# Patient Record
Sex: Female | Born: 1985 | Race: White | Hispanic: Yes | Marital: Married | Smoking: Never smoker
Health system: Southern US, Community
[De-identification: ages and names within clinical notes are randomized; demographics above are authoritative.]

## PROBLEM LIST (undated history)

## (undated) DIAGNOSIS — R42 Dizziness and giddiness: Secondary | ICD-10-CM

## (undated) DIAGNOSIS — R519 Headache, unspecified: Secondary | ICD-10-CM

## (undated) HISTORY — PX: NO PAST SURGERIES: SHX2092

## (undated) HISTORY — DX: Dizziness and giddiness: R42

## (undated) HISTORY — DX: Headache, unspecified: R51.9

---

## 2004-01-21 ENCOUNTER — Ambulatory Visit (HOSPITAL_COMMUNITY): Admission: RE | Admit: 2004-01-21 | Discharge: 2004-01-21 | Payer: Self-pay | Admitting: *Deleted

## 2004-06-06 ENCOUNTER — Ambulatory Visit: Payer: Self-pay | Admitting: *Deleted

## 2004-06-06 ENCOUNTER — Inpatient Hospital Stay (HOSPITAL_COMMUNITY): Admission: AD | Admit: 2004-06-06 | Discharge: 2004-06-09 | Payer: Self-pay | Admitting: *Deleted

## 2007-11-07 ENCOUNTER — Ambulatory Visit (HOSPITAL_COMMUNITY): Admission: RE | Admit: 2007-11-07 | Discharge: 2007-11-07 | Payer: Self-pay | Admitting: Obstetrics & Gynecology

## 2008-04-11 ENCOUNTER — Ambulatory Visit: Payer: Self-pay | Admitting: Obstetrics & Gynecology

## 2008-04-11 ENCOUNTER — Ambulatory Visit (HOSPITAL_COMMUNITY): Admission: RE | Admit: 2008-04-11 | Discharge: 2008-04-11 | Payer: Self-pay | Admitting: Family Medicine

## 2008-04-14 ENCOUNTER — Ambulatory Visit: Payer: Self-pay | Admitting: Obstetrics and Gynecology

## 2008-04-14 ENCOUNTER — Inpatient Hospital Stay (HOSPITAL_COMMUNITY): Admission: AD | Admit: 2008-04-14 | Discharge: 2008-04-16 | Payer: Self-pay | Admitting: Obstetrics & Gynecology

## 2008-04-14 ENCOUNTER — Inpatient Hospital Stay (HOSPITAL_COMMUNITY): Admission: AD | Admit: 2008-04-14 | Discharge: 2008-04-14 | Payer: Self-pay | Admitting: Obstetrics & Gynecology

## 2008-04-14 ENCOUNTER — Encounter (HOSPITAL_COMMUNITY): Payer: Self-pay | Admitting: Obstetrics and Gynecology

## 2008-11-08 ENCOUNTER — Inpatient Hospital Stay (HOSPITAL_COMMUNITY): Admission: AD | Admit: 2008-11-08 | Discharge: 2008-11-09 | Payer: Self-pay | Admitting: Obstetrics & Gynecology

## 2008-11-09 ENCOUNTER — Encounter: Payer: Self-pay | Admitting: Obstetrics & Gynecology

## 2008-11-26 ENCOUNTER — Ambulatory Visit: Payer: Self-pay | Admitting: Obstetrics and Gynecology

## 2008-11-26 LAB — CONVERTED CEMR LAB: hCG, Beta Chain, Quant, S: 462 milliintl units/mL

## 2008-11-27 ENCOUNTER — Ambulatory Visit (HOSPITAL_COMMUNITY): Admission: RE | Admit: 2008-11-27 | Discharge: 2008-11-27 | Payer: Self-pay | Admitting: Obstetrics and Gynecology

## 2008-12-03 ENCOUNTER — Ambulatory Visit: Payer: Self-pay | Admitting: Obstetrics & Gynecology

## 2008-12-03 ENCOUNTER — Encounter: Payer: Self-pay | Admitting: Obstetrics and Gynecology

## 2008-12-03 LAB — CONVERTED CEMR LAB: hCG, Beta Chain, Quant, S: 131.3 milliintl units/mL

## 2008-12-17 ENCOUNTER — Ambulatory Visit: Payer: Self-pay | Admitting: Obstetrics and Gynecology

## 2010-09-13 LAB — HCG, QUANTITATIVE, PREGNANCY: hCG, Beta Chain, Quant, S: 8242 m[IU]/mL — ABNORMAL HIGH (ref <5)

## 2010-09-13 LAB — GC/CHLAMYDIA PROBE AMP, GENITAL: GC Probe Amp, Genital: NEGATIVE

## 2010-09-13 LAB — WET PREP, GENITAL
Clue Cells Wet Prep HPF POC: NONE SEEN
Trich, Wet Prep: NONE SEEN

## 2010-09-13 LAB — CBC: MCHC: 35.4 g/dL (ref 30.0–36.0)

## 2010-09-13 LAB — ABO/RH: ABO/RH(D): O POS

## 2010-10-19 NOTE — Group Therapy Note (Signed)
Ariana Downs, VIRAG NO.:  [id number]   MEDICAL RECORD NO.:  [id number]          PATIENT TYPE:  WOC   LOCATION:  [facility]                   FACILITY:  [facility]   PHYSICIAN:  Argentina Donovan, MD        DATE OF BIRTH:  01-13-1986   DATE OF SERVICE:  11/26/2008                                  CLINIC NOTE   The patient is a 25 year old gravida 3, para 2-0-1-2 who was seen in the  maternity admissions office on June 5 and 6.  She had a missed abortion  at 9 weeks, was given with a quantitative beta hCG of 8242.  Was given  Cytotec at that time.  She passed everything the following day, had  heavy bleeding.  She has just spotted since that time.  Comes in with no  complaints.  She had a Pap smear in March of this year.  She has decided  on Depo-Provera as a method of contraception.  She does not want any  pregnancies in the near future.  We are going to give her her first shot  today and [facility].  Going to get a quantitative beta hCG.  If it is elevated still we will  call her and give her an ultrasound to confirm the completeness of the  abortion.           ______________________________  Argentina Donovan, MD     PR/MEDQ  D:  11/26/2008  T:  11/26/2008  Job:  [id number]

## 2010-10-29 IMAGING — US US OB COMP LESS 14 WK
1 series · 14 of 20 positions shown · non-contrast
Comparison: None this gestation.

CLINICAL DATA: 23-year-old G3 P2, LMP 08/10/2008, presenting with
cramping and vaginal bleeding with clots.

OBSTETRIC <14 WK ULTRASOUND 11/08/2008:
TECHNIQUE: Transabdominal ultrasound was performed for evaluation
of the gestation as well as the maternal uterus and adnexal
regions.

[Series 1: us ob comp less 14 wks · 0.24mm/px · 14 of 20 slices shown]
[im 1/20]
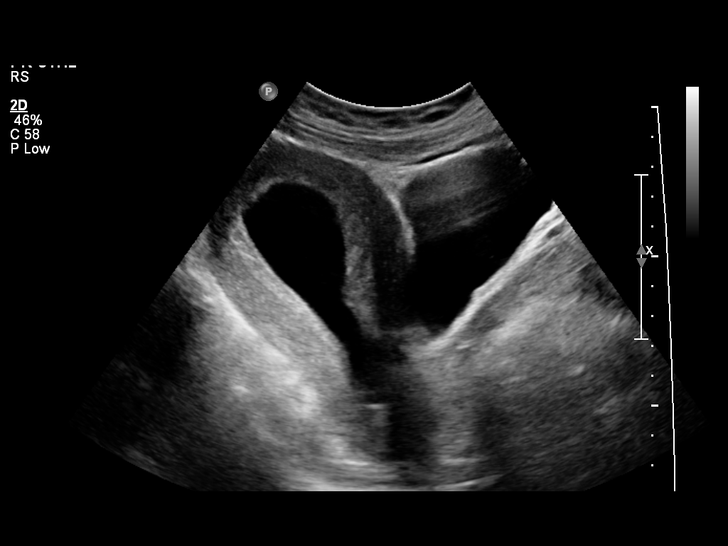
[im 3/20]
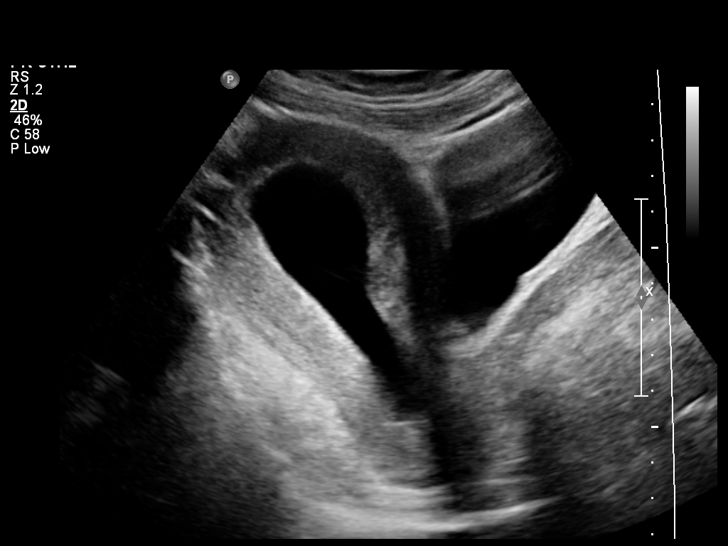
[im 4/20]
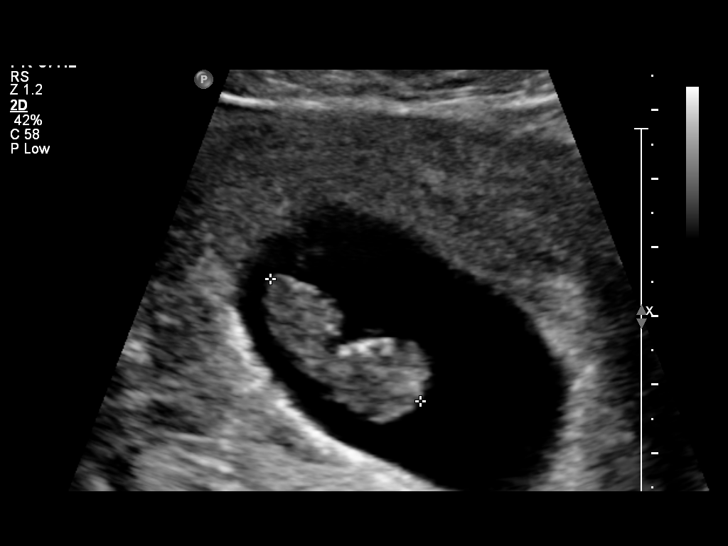
[im 6/20]
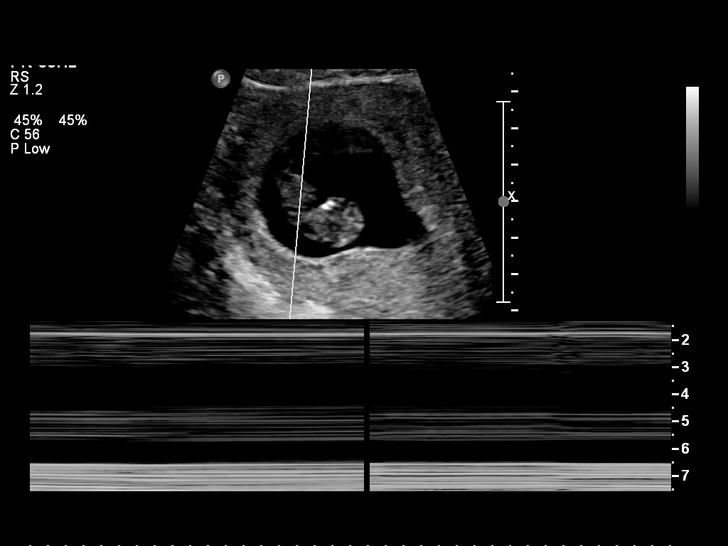
[im 7/20]
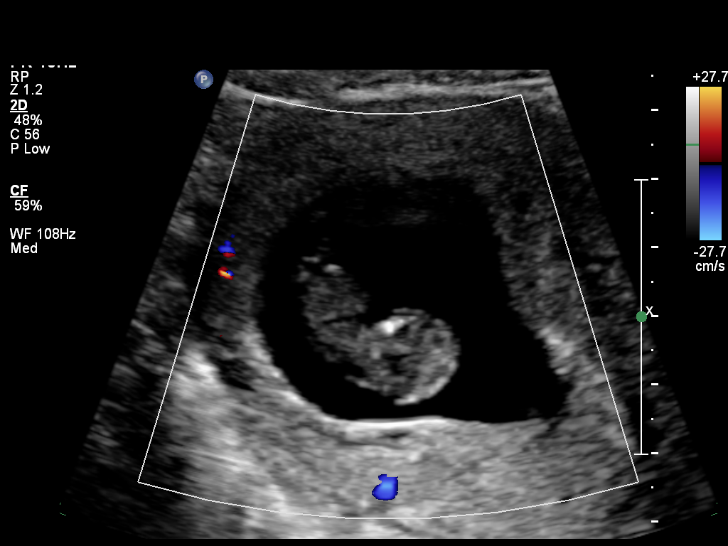
[im 8/20]
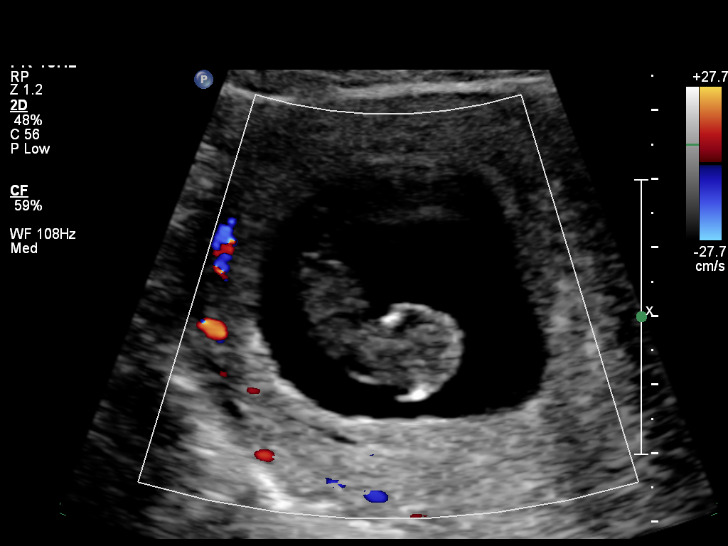
[im 10/20]
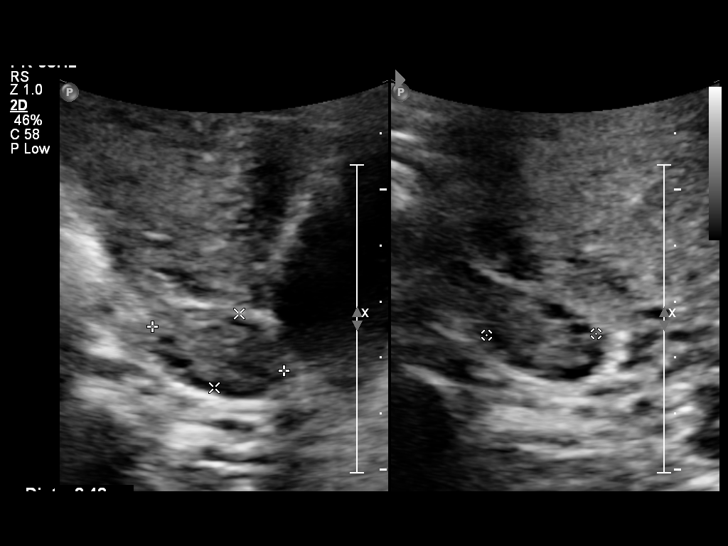
[im 11/20]
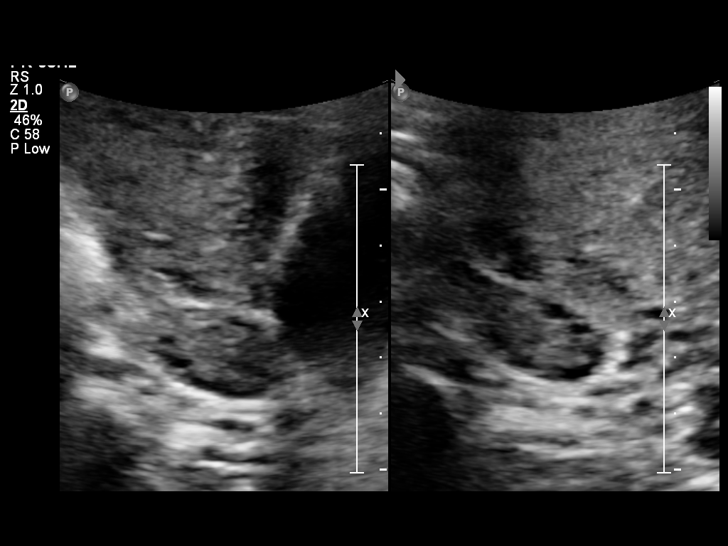
[im 13/20]
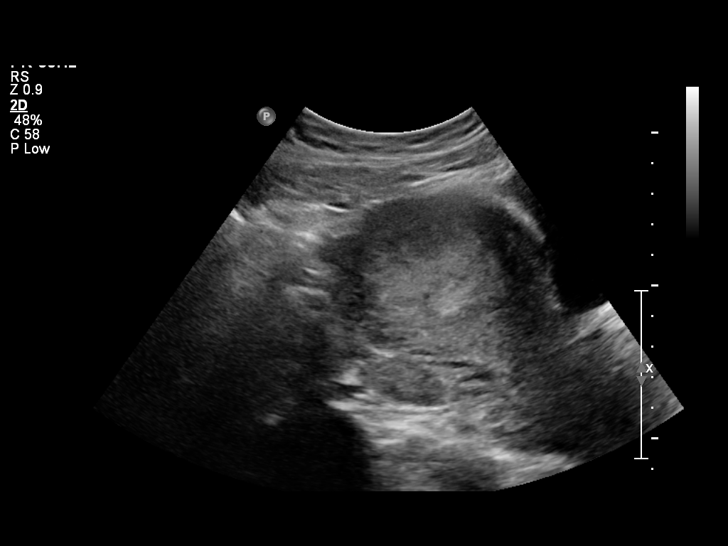
[im 14/20]
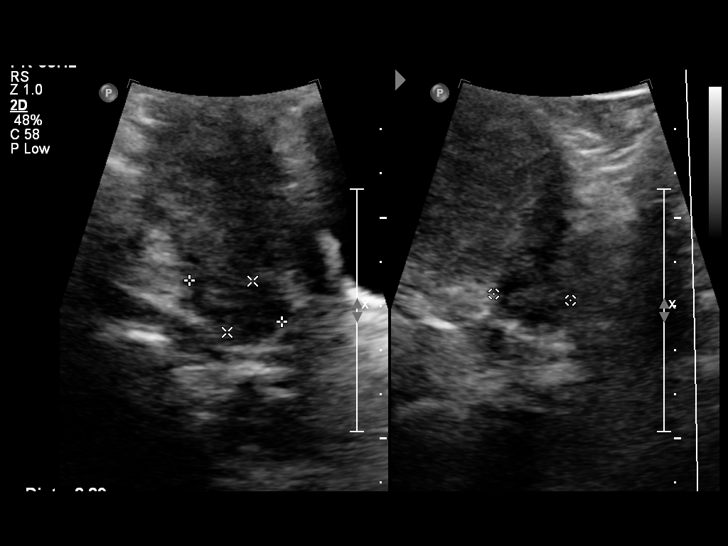
[im 16/20]
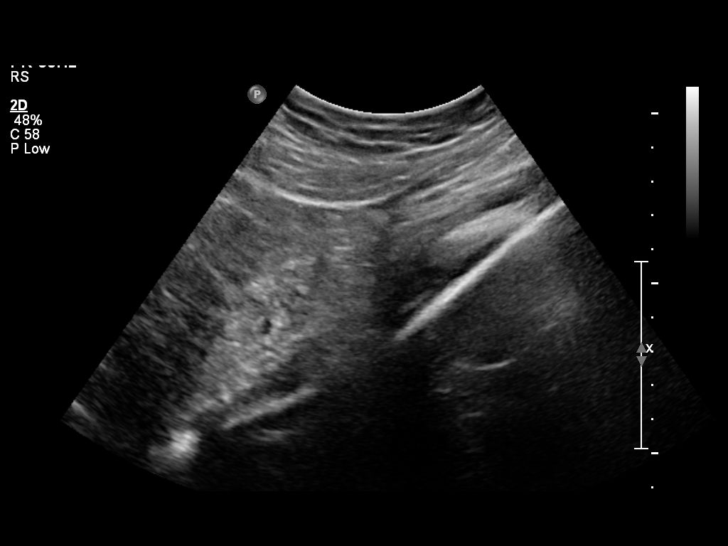
[im 17/20]
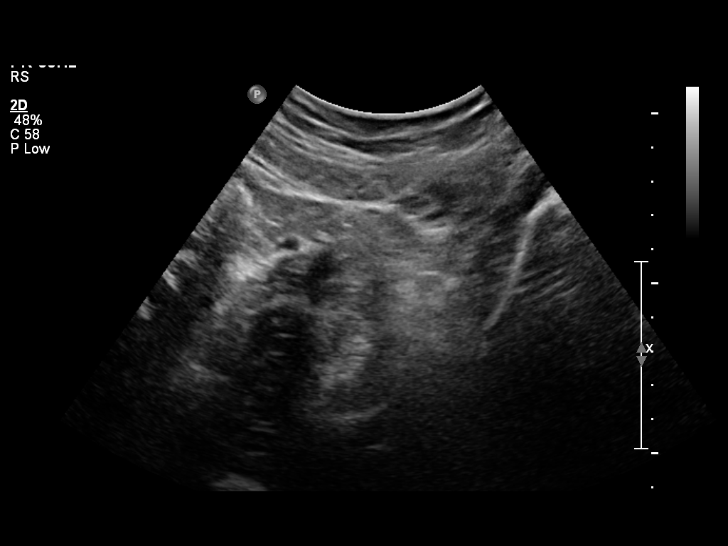
[im 18/20]
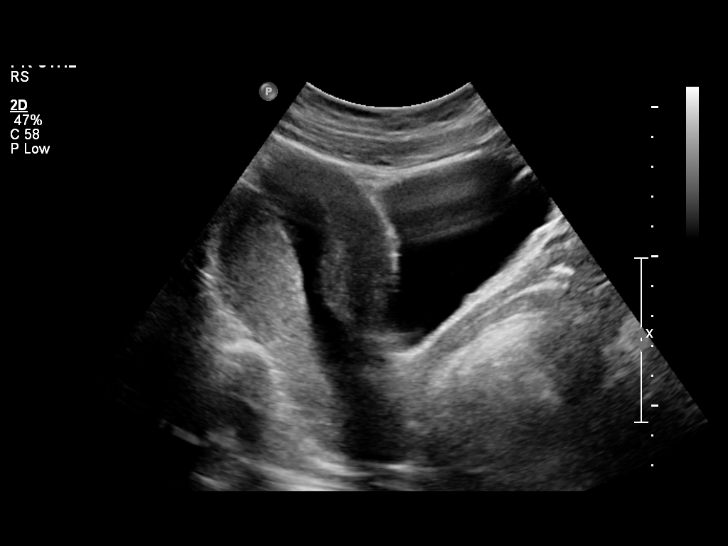
[im 20/20]
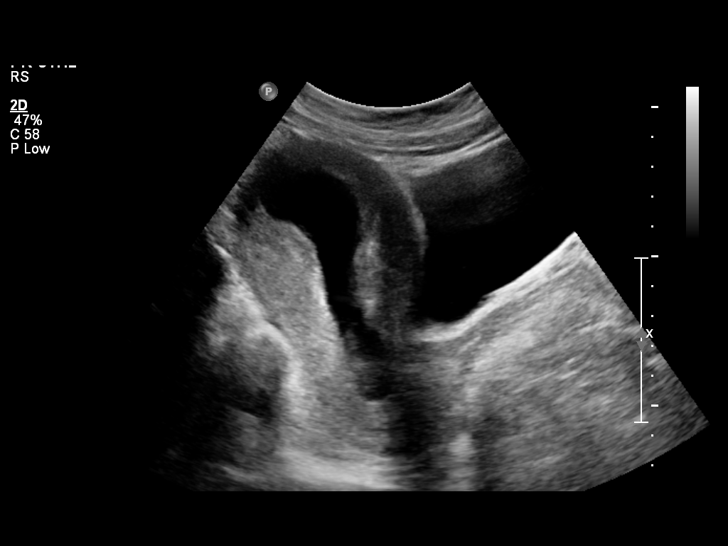

[14 of 20 positions shown; findings below may reference images not displayed]

Intrauterine gestational sac: Single, irregular in shape and
extending into the endocervical canal.
Yolk sac: Not visualized.
Embryo: Visualized.
Cardiac Activity: Not visualized. This was confirmed on the cine
clips submitted by the ultrasound technologist.

CRL:  2.79 cm         9w  5d

Maternal uterus/Adnexae:
No evidence of subchorionic hemorrhage.  Both ovaries visualized
and both are normal in appearance, containing small follicular
cysts.  Right ovary approximates 2.5 x 1.4 x 2.0 cm.  Left ovary
approximates 2.3 x 1.3 x 1.8 cm.  No adnexal masses or free fluid.
IMPRESSION: 1.  Intrauterine fetal demise.
2.  Gestational sac extending into the endocervical canal.
3.  Normal-appearing ovaries.  No adnexal masses or free fluid.

## 2011-03-08 LAB — CBC
Hemoglobin: 13
MCHC: 33.4
Platelets: 185
RBC: 4.11
RDW: 13.2
WBC: 14 — ABNORMAL HIGH

## 2014-01-07 ENCOUNTER — Encounter (HOSPITAL_COMMUNITY): Payer: Self-pay | Admitting: Emergency Medicine

## 2014-01-07 ENCOUNTER — Emergency Department (HOSPITAL_COMMUNITY)
Admission: EM | Admit: 2014-01-07 | Discharge: 2014-01-08 | Disposition: A | Discharge status: Home / Self Care | Payer: Self-pay | Attending: Emergency Medicine | Admitting: Emergency Medicine

## 2014-01-07 DIAGNOSIS — R300 Dysuria: Secondary | ICD-10-CM | POA: Insufficient documentation

## 2014-01-07 DIAGNOSIS — Z792 Long term (current) use of antibiotics: Secondary | ICD-10-CM | POA: Insufficient documentation

## 2014-01-07 DIAGNOSIS — N12 Tubulo-interstitial nephritis, not specified as acute or chronic: Secondary | ICD-10-CM | POA: Insufficient documentation

## 2014-01-07 DIAGNOSIS — Z3202 Encounter for pregnancy test, result negative: Secondary | ICD-10-CM | POA: Insufficient documentation

## 2014-01-07 DIAGNOSIS — R110 Nausea: Secondary | ICD-10-CM | POA: Insufficient documentation

## 2014-01-07 DIAGNOSIS — R000 Tachycardia, unspecified: Secondary | ICD-10-CM | POA: Insufficient documentation

## 2014-01-07 LAB — CBC WITH DIFFERENTIAL/PLATELET
BASOS PCT: 0 % (ref 0–1)
Basophils Absolute: 0 10*3/uL (ref 0.0–0.1)
Eosinophils Absolute: 0 10*3/uL (ref 0.0–0.7)
Eosinophils Relative: 0 % (ref 0–5)
HCT: 43.3 % (ref 36.0–46.0)
Hemoglobin: 14.1 g/dL (ref 12.0–15.0)
LYMPHS ABS: 1.3 10*3/uL (ref 0.7–4.0)
LYMPHS PCT: 10 % — AB (ref 12–46)
MCH: 28.5 pg (ref 26.0–34.0)
MCHC: 32.6 g/dL (ref 30.0–36.0)
MCV: 87.7 fL (ref 78.0–100.0)
MONO ABS: 0.5 10*3/uL (ref 0.1–1.0)
Monocytes Relative: 4 % (ref 3–12)
Neutro Abs: 11.4 10*3/uL — ABNORMAL HIGH (ref 1.7–7.7)
Neutrophils Relative %: 86 % — ABNORMAL HIGH (ref 43–77)
Platelets: 253 10*3/uL (ref 150–400)
RBC: 4.94 MIL/uL (ref 3.87–5.11)
RDW: 13.2 % (ref 11.5–15.5)
WBC: 13.2 10*3/uL — ABNORMAL HIGH (ref 4.0–10.5)

## 2014-01-07 LAB — URINALYSIS, ROUTINE W REFLEX MICROSCOPIC
BILIRUBIN URINE: NEGATIVE
Glucose, UA: NEGATIVE mg/dL
KETONES UR: NEGATIVE mg/dL
NITRITE: NEGATIVE
PH: 7 (ref 5.0–8.0)
Protein, ur: 100 mg/dL — AB
SPECIFIC GRAVITY, URINE: 1.014 (ref 1.005–1.030)
UROBILINOGEN UA: 1 mg/dL (ref 0.0–1.0)

## 2014-01-07 LAB — COMPREHENSIVE METABOLIC PANEL
ALBUMIN: 4 g/dL (ref 3.5–5.2)
ALK PHOS: 79 U/L (ref 39–117)
ALT: 47 U/L — AB (ref 0–35)
AST: 43 U/L — AB (ref 0–37)
Anion gap: 15 (ref 5–15)
BILIRUBIN TOTAL: 0.4 mg/dL (ref 0.3–1.2)
BUN: 14 mg/dL (ref 6–23)
CALCIUM: 9.7 mg/dL (ref 8.4–10.5)
CHLORIDE: 99 meq/L (ref 96–112)
CO2: 23 mEq/L (ref 19–32)
Creatinine, Ser: 1.13 mg/dL — ABNORMAL HIGH (ref 0.50–1.10)
GFR calc Af Amer: 75 mL/min — ABNORMAL LOW (ref >90)
GFR, EST NON AFRICAN AMERICAN: 65 mL/min — AB (ref >90)
Glucose, Bld: 124 mg/dL — ABNORMAL HIGH (ref 70–99)
Potassium: 3.9 mEq/L (ref 3.7–5.3)
SODIUM: 137 meq/L (ref 137–147)
TOTAL PROTEIN: 8.4 g/dL — AB (ref 6.0–8.3)

## 2014-01-07 LAB — URINE MICROSCOPIC-ADD ON

## 2014-01-07 LAB — PREGNANCY, URINE: Preg Test, Ur: NEGATIVE

## 2014-01-07 MED ORDER — SODIUM CHLORIDE 0.9 % IV BOLUS (SEPSIS)
1000.0000 mL | Freq: Once | INTRAVENOUS | Status: AC
  Administered 2014-01-07: 1000 mL via INTRAVENOUS

## 2014-01-07 MED ORDER — ONDANSETRON HCL 4 MG/2ML IJ SOLN
4.0000 mg | Freq: Once | INTRAMUSCULAR | Status: AC
  Administered 2014-01-07: 4 mg via INTRAVENOUS
  Filled 2014-01-07: qty 2

## 2014-01-07 MED ORDER — DEXTROSE 5 % IV SOLN
1.0000 g | Freq: Once | INTRAVENOUS | Status: AC
  Administered 2014-01-07: 1 g via INTRAVENOUS
  Filled 2014-01-07: qty 10

## 2014-01-07 NOTE — ED Notes (Signed)
Pt alert, NAD, calm, itneractive, resps e/u, speaking in clear complete sentences, VSS, c/o HA and bilateral ear pain, also dizziness. (denies: sob or nausea). [facility]

## 2014-01-07 NOTE — ED Notes (Signed)
Pt. reports dysuria , fever , headache and dizziness onset last Sunday .

## 2014-01-07 NOTE — ED Notes (Signed)
Pt c/o burning with urination x's 3 days.  St's she has had a fever.  Took tylenol at 2pm today.  Pt denies any nausea or vomiting.

## 2014-01-07 NOTE — ED Provider Notes (Signed)
CSN: [id number]     Arrival date & time 01/07/14  1952 History   First MD Initiated Contact with Patient 01/07/14 2213     Chief Complaint  Patient presents with  . Dysuria  . Fever     (Consider location/radiation/quality/duration/timing/severity/associated sxs/prior Treatment) Patient is a 28 y.o. female presenting with general illness. The history is provided by the patient.  Illness Severity:  Moderate Onset quality:  Gradual Duration:  4 days Timing:  Constant Progression:  Worsening Chronicity:  New Associated symptoms: nausea   Associated symptoms: no chest pain, no congestion, no fever, no headaches, no myalgias, no rhinorrhea, no shortness of breath, no vomiting and no wheezing    28 yo F with a chief complaint of dysuria and low back pain. Patient states this going on for the past 3 or 4 days. Patient has gotten progressively worse. Patient is unable to eat or drink anything at home due to the nausea. Denies dizziness, presyncope.  Denies hx of past UTI.   History reviewed. No pertinent past medical history. History reviewed. No pertinent past surgical history. No family history on file. History  Substance Use Topics  . Smoking status: Never Smoker   . Smokeless tobacco: Not on file  . Alcohol Use: No   OB History   Grav Para Term Preterm Abortions TAB SAB Ect Mult Living                 Review of Systems  Constitutional: Negative for fever and chills.  HENT: Negative for congestion and rhinorrhea.   Eyes: Negative for redness and visual disturbance.  Respiratory: Negative for shortness of breath and wheezing.   Cardiovascular: Negative for chest pain and palpitations.  Gastrointestinal: Positive for nausea. Negative for vomiting.  Genitourinary: Positive for dysuria and flank pain (bilateral). Negative for urgency.  Musculoskeletal: Negative for arthralgias and myalgias.  Skin: Negative for pallor and wound.  Neurological: Negative for dizziness and  headaches.      Allergies  Review of patient's allergies indicates no known allergies.  Home Medications   Prior to Admission medications   Medication Sig Start Date End Date Taking? Authorizing Provider  cephALEXin (KEFLEX) 500 MG capsule Take 1 capsule (500 mg total) by mouth 4 (four) times daily. 01/08/14   Melene Planan Amisha Pospisil, MD  promethazine (PHENERGAN) 25 MG tablet Take 1 tablet (25 mg total) by mouth every 6 (six) hours as needed for nausea or vomiting. 01/08/14   Melene Planan Mychael Soots, MD   BP 89/75  Pulse 96  Temp(Src) 98.9 F (37.2 C) (Oral)  Resp 16  SpO2 100%  LMP 12/29/2013 Physical Exam  Constitutional: She is oriented to person, place, and time. She appears well-developed and well-nourished. No distress.  HENT:  Head: Normocephalic and atraumatic.  Eyes: EOM are normal. Pupils are equal, round, and reactive to light.  Neck: Normal range of motion. Neck supple.  Cardiovascular: Regular rhythm.  Exam reveals no gallop and no friction rub.   No murmur heard. Tachycardia   Pulmonary/Chest: Effort normal. She has no wheezes. She has no rales.  Abdominal: Soft. She exhibits no distension. There is no tenderness.  Musculoskeletal: She exhibits no edema and no tenderness.  Neurological: She is alert and oriented to person, place, and time.  Skin: Skin is warm and dry. She is not diaphoretic.  Psychiatric: She has a normal mood and affect. Her behavior is normal.    ED Course  Procedures (including critical care time) Labs Review Labs Reviewed  URINALYSIS, ROUTINE  W REFLEX MICROSCOPIC - Abnormal; Notable for the following:    APPearance TURBID (*)    Hgb urine dipstick LARGE (*)    Protein, ur 100 (*)    Leukocytes, UA LARGE (*)    All other components within normal limits  CBC WITH DIFFERENTIAL - Abnormal; Notable for the following:    WBC 13.2 (*)    Neutrophils Relative % 86 (*)    Neutro Abs 11.4 (*)    Lymphocytes Relative 10 (*)    All other components within normal limits   COMPREHENSIVE METABOLIC PANEL - Abnormal; Notable for the following:    Glucose, Bld 124 (*)    Creatinine, Ser 1.13 (*)    Total Protein 8.4 (*)    AST 43 (*)    ALT 47 (*)    GFR calc non Af Amer 65 (*)    GFR calc Af Amer 75 (*)    All other components within normal limits  URINE MICROSCOPIC-ADD ON - Abnormal; Notable for the following:    Squamous Epithelial / LPF MANY (*)    Bacteria, UA MANY (*)    All other components within normal limits  PREGNANCY, URINE    Imaging Review No results found.   EKG Interpretation None      MDM   Final diagnoses:  Pyelonephritis    28 yo F with a chief complaint of dysuria and low back pain. Patient states this going on for the past 3 or 4 days. Patient has gotten progressively worse. Patient is unable to eat or drink anything at home due to the nausea.   Patient with UTI elevated white count suspect pyelonephritis. We'll give the patient a bolus of fluids due to her tachycardia and give her a dose of ceftriaxone as well as some Zofran. Patient is able to tolerate by mouth we'll discharge her home.  12:11 AM:  I have discussed the diagnosis/risks/treatment options with the patient and believe the pt to be eligible for discharge home to follow-up with PCP. We also discussed returning to the ED immediately if new or worsening sx occur. We discussed the sx which are most concerning (e.g., inability to eat or drink, syncope, sudden worsening pain) that necessitate immediate return. Medications administered to the patient during their visit and any new prescriptions provided to the patient are listed below.  Medications given during this visit Medications  ibuprofen (ADVIL,MOTRIN) tablet 800 mg (not administered)  sodium chloride 0.9 % bolus 1,000 mL (1,000 mLs Intravenous New Bag/Given 01/07/14 2320)  cefTRIAXone (ROCEPHIN) 1 g in dextrose 5 % 50 mL IVPB (0 g Intravenous Stopped 01/08/14 0008)  ondansetron (ZOFRAN) injection 4 mg (4 mg  Intravenous Given 01/07/14 2331)    New Prescriptions   CEPHALEXIN (KEFLEX) 500 MG CAPSULE    Take 1 capsule (500 mg total) by mouth 4 (four) times daily.   PROMETHAZINE (PHENERGAN) 25 MG TABLET    Take 1 tablet (25 mg total) by mouth every 6 (six) hours as needed for nausea or vomiting.     Melene Plan, MD 01/08/14 [phone]

## 2014-01-08 MED ORDER — CEPHALEXIN 500 MG PO CAPS: 500.0000 mg | ORAL_CAPSULE | Freq: Four times a day (QID) | ORAL | Status: DC

## 2014-01-08 MED ORDER — IBUPROFEN 800 MG PO TABS
800.0000 mg | ORAL_TABLET | Freq: Once | ORAL | Status: AC
  Administered 2014-01-08: 800 mg via ORAL
  Filled 2014-01-08: qty 1

## 2014-01-08 MED ORDER — PROMETHAZINE HCL 25 MG PO TABS: 25.0000 mg | ORAL_TABLET | Freq: Four times a day (QID) | ORAL | Status: DC | PRN

## 2014-01-08 NOTE — ED Provider Notes (Signed)
I performed a history and physical examination of  Ariana Downs and discussed her management with resident doctor. I agree with the history, physical, assessment, and plan of care, with the following exceptions: None I was present for the following procedures: None  Time Spent in Critical Care of the patient: None  Time spent in discussions with the patient and family: 5 min  Keaden Gunnoe  Pt with abd pain, n/v and flank pain. UA is +, so she has pyelo. PO challenge, ivab and d/c if po challenge passed.  Derwood KaplanAnkit Aundrea Horace, MD 01/08/14 [phone]

## 2014-01-08 NOTE — Discharge Instructions (Signed)
Take 4 over the counter ibuprofen tablets 3 times a day or 2 over-the-counter naproxen tablets twice a day for pain.  Pyelonephritis, Adult Pyelonephritis is a kidney infection. A kidney infection can happen quickly, or it can last for a long time. HOME CARE   Take your medicine (antibiotics) as told. Finish it even if you start to feel better.  Keep all doctor visits as told.  Drink enough fluids to keep your pee (urine) clear or pale yellow.  Only take medicine as told by your doctor. GET HELP RIGHT AWAY IF:   You have a fever or lasting symptoms for more than 2-3 days.  You have a fever and your symptoms suddenly get worse.  You cannot take your medicine or drink fluids as told.  You have chills and shaking.  You feel very weak or pass out (faint).  You do not feel better after 2 days. MAKE SURE YOU:  Understand these instructions.  Will watch your condition.  Will get help right away if you are not doing well or get worse. Document Released: 06/30/2004 Document Revised: 11/22/2011 Document Reviewed: 11/10/2010 [facility] Patient Information 2015 [employer] [place], [place]. This information is not intended to replace advice given to you by your health care provider. Make sure you discuss any questions you have with your health care provider.

## 2014-01-08 NOTE — ED Notes (Signed)
Dr. Adela LankFloyd in to see pt. BP low, will continue to monitor.

## 2015-07-20 ENCOUNTER — Emergency Department (HOSPITAL_COMMUNITY): Payer: Self-pay

## 2015-07-20 ENCOUNTER — Emergency Department (HOSPITAL_COMMUNITY)
Admission: EM | Admit: 2015-07-20 | Discharge: 2015-07-20 | Disposition: A | Discharge status: Home / Self Care | Payer: Self-pay | Attending: Emergency Medicine | Admitting: Emergency Medicine

## 2015-07-20 ENCOUNTER — Encounter (HOSPITAL_COMMUNITY): Payer: Self-pay

## 2015-07-20 DIAGNOSIS — Z79899 Other long term (current) drug therapy: Secondary | ICD-10-CM | POA: Insufficient documentation

## 2015-07-20 DIAGNOSIS — R079 Chest pain, unspecified: Secondary | ICD-10-CM | POA: Insufficient documentation

## 2015-07-20 DIAGNOSIS — R51 Headache: Secondary | ICD-10-CM | POA: Insufficient documentation

## 2015-07-20 DIAGNOSIS — R42 Dizziness and giddiness: Secondary | ICD-10-CM | POA: Insufficient documentation

## 2015-07-20 DIAGNOSIS — Z3202 Encounter for pregnancy test, result negative: Secondary | ICD-10-CM | POA: Insufficient documentation

## 2015-07-20 DIAGNOSIS — H9201 Otalgia, right ear: Secondary | ICD-10-CM | POA: Insufficient documentation

## 2015-07-20 LAB — BASIC METABOLIC PANEL
ANION GAP: 8 (ref 5–15)
BUN: 16 mg/dL (ref 6–20)
CHLORIDE: 109 mmol/L (ref 101–111)
CO2: 23 mmol/L (ref 22–32)
Calcium: 9.2 mg/dL (ref 8.9–10.3)
Creatinine, Ser: 1 mg/dL (ref 0.44–1.00)
Glucose, Bld: 106 mg/dL — ABNORMAL HIGH (ref 65–99)
POTASSIUM: 4.1 mmol/L (ref 3.5–5.1)
SODIUM: 140 mmol/L (ref 135–145)

## 2015-07-20 LAB — I-STAT TROPONIN, ED: Troponin i, poc: 0 ng/mL (ref 0.00–0.08)

## 2015-07-20 LAB — POC URINE PREG, ED: PREG TEST UR: NEGATIVE

## 2015-07-20 LAB — CBC WITH DIFFERENTIAL/PLATELET
BASOS ABS: 0 10*3/uL (ref 0.0–0.1)
Basophils Relative: 0 %
Eosinophils Absolute: 0 10*3/uL (ref 0.0–0.7)
Eosinophils Relative: 0 %
HEMATOCRIT: 38.4 % (ref 36.0–46.0)
HEMOGLOBIN: 12.4 g/dL (ref 12.0–15.0)
LYMPHS PCT: 21 %
Lymphs Abs: 1.1 10*3/uL (ref 0.7–4.0)
MCH: 29.2 pg (ref 26.0–34.0)
MCHC: 32.3 g/dL (ref 30.0–36.0)
MCV: 90.4 fL (ref 78.0–100.0)
MONO ABS: 0.2 10*3/uL (ref 0.1–1.0)
MONOS PCT: 4 %
NEUTROS ABS: 4.1 10*3/uL (ref 1.7–7.7)
NEUTROS PCT: 75 %
Platelets: 237 10*3/uL (ref 150–400)
RBC: 4.25 MIL/uL (ref 3.87–5.11)
RDW: 13.2 % (ref 11.5–15.5)
WBC: 5.5 10*3/uL (ref 4.0–10.5)

## 2015-07-20 MED ORDER — MECLIZINE HCL 25 MG PO TABS
25.0000 mg | ORAL_TABLET | Freq: Once | ORAL | Status: AC
  Administered 2015-07-20: 25 mg via ORAL
  Filled 2015-07-20: qty 1

## 2015-07-20 MED ORDER — MECLIZINE HCL 25 MG PO TABS
25.0000 mg | ORAL_TABLET | Freq: Once | ORAL | Status: AC
Start: 2015-07-20 — End: 2015-07-20
  Administered 2015-07-20: 25 mg via ORAL
  Filled 2015-07-20: qty 1

## 2015-07-20 MED ORDER — MECLIZINE HCL 50 MG PO TABS: 25.0000 mg | ORAL_TABLET | Freq: Three times a day (TID) | ORAL | Status: DC | PRN

## 2015-07-20 NOTE — ED Notes (Signed)
Dr. Clarene Duke made aware of patient's continued complaints of chest pain which she rates as a 5/10.

## 2015-07-20 NOTE — ED Notes (Signed)
Pt presents via EMS from home with c/o dizziness/vertigo. Pt has a hx of same, reports that she started feeling the dizziness this morning when she stood up. She initially tried to stand up and then had to lay back down because of the dizziness. Vitals stable for EMS, 12 lead unremarkable per EMS. Aspirin given by [facility] prior to EMS arrival.

## 2015-07-20 NOTE — ED Notes (Signed)
Patient drinking water.  Reports she is still dizzy and is still having chest pain she rates as a 5.  Patient reports MD was told about chest pain.

## 2015-07-20 NOTE — ED Notes (Signed)
Bed: ZO10 Expected date: 07/20/15 Expected time:  Means of arrival:  Comments: Female Vertigo

## 2015-07-20 NOTE — ED Provider Notes (Signed)
CSN: [id number]     Arrival date & time 07/20/15  4098 History   First MD Initiated Contact with Patient 07/20/15 1011     Chief Complaint  Patient presents with  . Dizziness     (Consider location/radiation/quality/duration/timing/severity/associated sxs/prior Treatment) HPI Comments: 30 year old female who presents with dizziness. The patient states that this morning when she stood up from bed, she had a sudden onset of dizziness which she describes as room spinning sensation. She had to lay back down because of sx and called EMS. She endorses very mild headache but denies any sudden onset of severe headache. No visual changes. No nausea, vomiting, diarrhea, or abdominal pain. No recent illness. Patient does endorse right ear pain and a mildly scratchy throat. Patient states that she had these same symptoms 2 years ago and saw a PCP, she took meclizine which mildly helped her symptoms.  Patient is a 29 y.o. female presenting with dizziness. The history is provided by the patient.  Dizziness   History reviewed. No pertinent past medical history. History reviewed. No pertinent past surgical history. No family history on file. Social History  Substance Use Topics  . Smoking status: Never Smoker   . Smokeless tobacco: None  . Alcohol Use: No   OB History    No data available     Review of Systems  Neurological: Positive for dizziness.   10 Systems reviewed and are negative for acute change except as noted in the HPI.    Allergies  Review of patient's allergies indicates no known allergies.  Home Medications   Prior to Admission medications   Medication Sig Start Date End Date Taking? Authorizing Provider  acetaminophen (TYLENOL) 500 MG tablet Take 1,000 mg by mouth every 6 (six) hours as needed for moderate pain or headache.   Yes Historical Provider, MD  Multiple Vitamin (MULTIVITAMIN WITH MINERALS) TABS tablet Take 1 tablet by mouth daily.   Yes Historical Provider, MD   meclizine (ANTIVERT) 50 MG tablet Take 0.5 tablets (25 mg total) by mouth 3 (three) times daily as needed for dizziness. 07/20/15   Ambrose Finland Little, MD   BP 118/71 mmHg  Pulse 88  Temp(Src) 98.4 F (36.9 C) (Oral)  Resp 13  SpO2 99%  LMP 06/27/2015 (Approximate) Physical Exam  Constitutional: She is oriented to person, place, and time. She appears well-developed and well-nourished. No distress.  Awake, alert  HENT:  Head: Normocephalic and atraumatic.  Right Ear: Tympanic membrane and ear canal normal.  Left Ear: Tympanic membrane and ear canal normal.  Eyes: Conjunctivae and EOM are normal. Pupils are equal, round, and reactive to light.  Neck: Neck supple.  Cardiovascular: Normal rate, regular rhythm and normal heart sounds.   No murmur heard. Pulmonary/Chest: Effort normal and breath sounds normal. No respiratory distress.  Abdominal: Soft. Bowel sounds are normal. She exhibits no distension.  Musculoskeletal: She exhibits no edema.  5/5 strength and normal sensation x all 4 ext  Neurological: She is alert and oriented to person, place, and time. She has normal reflexes. No cranial nerve deficit. She exhibits normal muscle tone.  Fluent speech, normal finger-to-nose testing, negative pronator drift  Skin: Skin is warm and dry.  Psychiatric: She has a normal mood and affect. Judgment and thought content normal.  Nursing note and vitals reviewed.   ED Course  Procedures (including critical care time) Labs Review Labs Reviewed  BASIC METABOLIC PANEL - Abnormal; Notable for the following:    Glucose, Bld 106 (*)  All other components within normal limits  CBC WITH DIFFERENTIAL/PLATELET  POC URINE PREG, ED  Rosezena Sensor, ED    Imaging Review Dg Chest 2 View  07/20/2015  CLINICAL DATA:  Chest pain, shortness of breath EXAM: CHEST  2 VIEW COMPARISON:  None. FINDINGS: There is no focal parenchymal opacity. There is no pleural effusion or pneumothorax. The heart and  mediastinal contours are unremarkable. The osseous structures are unremarkable. IMPRESSION: No active cardiopulmonary disease. Electronically Signed   By: Elige Ko   On: 07/20/2015 13:25   I have personally reviewed and evaluated these lab results as part of my medical decision-making.   EKG Interpretation   Date/Time:  Monday July 20 2015 10:55:25 EST Ventricular Rate:  96 PR Interval:  169 QRS Duration: 87 QT Interval:  342 QTC Calculation: 432 R Axis:   12 Text Interpretation:  Sinus rhythm EKG WITHIN NORMAL LIMITS Confirmed by  LITTLE MD, RACHEL [phone]) on 07/20/2015 11:27:24 AM     Medications  meclizine (ANTIVERT) tablet 25 mg (25 mg Oral Given 07/20/15 1101)  meclizine (ANTIVERT) tablet 25 mg (25 mg Oral Given 07/20/15 1247)     MDM   Final diagnoses:  Vertigo  Chest pain, unspecified chest pain type   patient presents with acute onset of dizziness that began when she stood up this morning from bed. She has had a similar episode previously. She was well-appearing with normal vital signs. Normal neurologic exam. No severe headache or neurologic deficits to suggest intracranial process. Gave the patient meclizine and discussed supportive care instructions including Epley maneuver at home.  During ED course, the patient complained of chest pain that was central, nonradiating, and intermittent since this morning. She denied any associated shortness of breath. No nausea or vomiting. No recent travel, OCP use, or family history of blood clots. Patient is PERC negative therefore PE unlikely. EKG unremarkable, chest x-ray normal, and basic lab work including troponin is normal. I discussed supportive care as well as return precautions. Patient voiced understanding and was discharged in satisfactory condition.  Laurence Spates, MD 07/20/15 [phone]

## 2019-05-24 ENCOUNTER — Other Ambulatory Visit (HOSPITAL_COMMUNITY): Payer: Self-pay | Admitting: Family

## 2019-05-24 DIAGNOSIS — Z3682 Encounter for antenatal screening for nuchal translucency: Secondary | ICD-10-CM

## 2019-05-24 DIAGNOSIS — Z3A13 13 weeks gestation of pregnancy: Secondary | ICD-10-CM

## 2019-06-04 ENCOUNTER — Encounter (HOSPITAL_COMMUNITY): Payer: Self-pay

## 2019-06-07 NOTE — L&D Delivery Note (Signed)
OB/[facility]N Faculty Practice Delivery Note  Ariana Downs is a 34 y.o. G3T5176 s/p vaginal delivery at [redacted]w[redacted]d. She was admitted for IOL 2/2 gHTN v pre-E w/o SF.   ROM: 2h 70m with clear fluid, terminal meconium GBS Status: negative Maximum Maternal Temperature: 99.9*F  Labor Progress: . Induction was started with a FB. Pitocin was started after it came out and then she SROMed. She progressed to complete and delivered shortly after.  Delivery Date/Time: 11/25/19, 0747 Delivery: Called to room and patient was complete and pushing. Head delivered ROA. No nuchal cord present. Shoulder and body delivered in usual fashion. Infant with spontaneous cry, placed on mother's abdomen, dried and stimulated. Cord clamped x 2 after 1-minute delay, and cut by FOB under my direct supervision. Cord blood drawn. Placenta delivered spontaneously with gentle cord traction. Fundus firm with massage and Pitocin. Labia, perineum, vagina, and cervix were inspected, and bilateral labial tears and a 1st degree perineal tear were noted- repaired with Vicryl in the usual fashion.   Placenta: 3 vessel cord, intact, to L&D Complications: None immediate Lacerations: Bilateral labial and 1st degree preineal- repaired with Vicryl in the usual fashion EBL: 260 mL Analgesia: epidural  Postpartum Planning [x]  message to sent to schedule follow-up  [x]  vaccines UTD  Infant: female  APGARs 8, 9  weight per medical record  , DO OB/GYN Fellow, Faculty Practice

## 2019-06-10 ENCOUNTER — Other Ambulatory Visit (HOSPITAL_COMMUNITY): Payer: Self-pay | Admitting: *Deleted

## 2019-06-10 ENCOUNTER — Ambulatory Visit (HOSPITAL_COMMUNITY): Payer: Self-pay

## 2019-06-10 ENCOUNTER — Ambulatory Visit (HOSPITAL_COMMUNITY): Payer: Self-pay | Admitting: *Deleted

## 2019-06-10 ENCOUNTER — Encounter (HOSPITAL_COMMUNITY): Payer: Self-pay | Admitting: *Deleted

## 2019-06-10 ENCOUNTER — Other Ambulatory Visit: Payer: Self-pay

## 2019-06-10 ENCOUNTER — Ambulatory Visit (HOSPITAL_BASED_OUTPATIENT_CLINIC_OR_DEPARTMENT_OTHER): Payer: Self-pay | Admitting: Genetic Counselor

## 2019-06-10 ENCOUNTER — Ambulatory Visit (HOSPITAL_COMMUNITY): Payer: Self-pay | Admitting: Family

## 2019-06-10 ENCOUNTER — Ambulatory Visit (HOSPITAL_COMMUNITY)
Admission: RE | Admit: 2019-06-10 | Discharge: 2019-06-10 | Disposition: A | Discharge status: Home / Self Care | Payer: Self-pay | Source: Ambulatory Visit | Attending: Obstetrics and Gynecology | Admitting: Obstetrics and Gynecology

## 2019-06-10 VITALS — BP 127/83 | HR 102 | Temp 97.9°F | Wt 221.8 lb

## 2019-06-10 DIAGNOSIS — Z36 Encounter for antenatal screening for chromosomal anomalies: Secondary | ICD-10-CM

## 2019-06-10 DIAGNOSIS — O09521 Supervision of elderly multigravida, first trimester: Secondary | ICD-10-CM

## 2019-06-10 DIAGNOSIS — O1211 Gestational proteinuria, first trimester: Secondary | ICD-10-CM

## 2019-06-10 DIAGNOSIS — O09522 Supervision of elderly multigravida, second trimester: Secondary | ICD-10-CM | POA: Insufficient documentation

## 2019-06-10 DIAGNOSIS — Z315 Encounter for genetic counseling: Secondary | ICD-10-CM

## 2019-06-10 DIAGNOSIS — Z3A13 13 weeks gestation of pregnancy: Secondary | ICD-10-CM

## 2019-06-10 DIAGNOSIS — O09529 Supervision of elderly multigravida, unspecified trimester: Secondary | ICD-10-CM | POA: Insufficient documentation

## 2019-06-10 DIAGNOSIS — O99211 Obesity complicating pregnancy, first trimester: Secondary | ICD-10-CM

## 2019-06-10 DIAGNOSIS — Z3682 Encounter for antenatal screening for nuchal translucency: Secondary | ICD-10-CM | POA: Insufficient documentation

## 2019-06-10 DIAGNOSIS — O24311 Unspecified pre-existing diabetes mellitus in pregnancy, first trimester: Secondary | ICD-10-CM

## 2019-06-10 NOTE — Progress Notes (Signed)
06/10/2019  Ariana Downs August 14, 1985 MRN: [id number] DOV: 06/10/2019  Ms. Downs presented to the [facility] for a genetics consultation regarding advanced maternal age. Ariana Downs came to her appointment alone due to COVID-19 visitor restrictions. The session was facilitated by a [facility] Spanish interpreter.   Indication for genetic counseling - Advanced maternal age  Prenatal history  Ariana Downs is a [id number], 34 y.o. female. Her current pregnancy has completed [redacted]w[redacted]d (Estimated Date of Delivery: 12/10/19).  Ariana Downs denied exposure to environmental toxins or chemical agents. She denied the use of alcohol, tobacco or street drugs. She reported taking prenatal vitamins. She denied significant viral illnesses, fevers, and bleeding during the course of her pregnancy. Her medical and surgical histories were noncontributory.  Family History  A three generation pedigree was drafted and reviewed. The family history is remarkable for the following:  - Ariana Downs has a 75 year old son who had speech delay in childhood. His medical and developmental history is otherwise noncontributory. We discussed that many times, developmental delays such as speech delay are multifactorial in nature, occurring due to a combination of genetic and environmental factors that are difficult to identify. Developmental delays can appear to run in families; thus, there is a chance that the couple's other children could also experience developmental delays of some kind. Ariana Downs understands that she should make the pediatrician aware of any concerns she has about her children's development.  - Ms. Downs had a maternal aunt who died at the age of 36 due to uterine cancer. She had another maternal aunt who was diagnosed with breast cancer at age 68. Ariana Downs partner, Ariana Downs, had a maternal aunt who died in  her 22s of an unknown type of cancer. Ariana Downs also has a paternal uncle who had stomach cancer that was diagnosed at 80 years and a paternal uncle who was diagnosed with throat cancer at 45 years. Though most cancers are thought to be sporadic or due to environmental factors, some families appear to have a strong predisposition to cancers. When considering a family history of cancer, we look for common types of cancer in multiple family members occurring at younger than typical ages. We discussed the option of meeting with a cancer genetic counselor to discuss any possible screening or testing options available. If the couple is concerned about their family histories of cancer and would like to learn more about their families' chances for an inherited cancer syndrome, their healthcare provider may refer them or their relatives to the [facility] [phone]).   The remaining family histories were reviewed and found to be noncontributory for birth defects, intellectual disability, recurrent pregnancy loss, and known genetic conditions.    The patient's ethnicity is [place]. The father of the pregnancy's ethnicity is [place]. Ashkenazi Jewish ancestry and consanguinity were denied. Pedigree will be scanned under Media.  Discussion  Ms. Downs was referred to genetic counseling for advanced maternal age, as she will be 34 years old at the time of delivery. We discussed that as a woman ages, the risk for certain chromosomal conditions, such as trisomy 26 (Down syndrome), trisomy 30, and trisomy 18 increases. These conditions often are not inherited, but instead occur due to an error in chromosomal division during the formation of sperm and egg cells in a process called nondisjunction. At Ms. Downs's age and during the second trimester, there is approximately a 1 in 141 (0.7%) chance of having a child with  a chromosomal abnormality. Her age-related risk to have a child with Down  syndrome specifically is 1 in 310 (0.3%) in the second trimester. We briefly reviewed features associated with Down syndrome, trisomy 75, and trisomy 39.    We reviewed noninvasive prenatal screening (NIPS) as an available screening option. Specifically, we discussed that NIPS analyzes cell free DNA originating from the placenta that is found in the maternal blood circulation during pregnancy. This test is not diagnostic for chromosome conditions, but can provide information regarding the presence or absence of extra fetal DNA for chromosomes 13, 18, 21, and the sex chromosomes. Thus, it would not identify or rule out all fetal aneuploidy. The reported detection rate is 91-99% for trisomies 21, 18, 13, and sex chromosome aneuploidies. The false positive rate is reported to be less than 0.1% for any of these conditions. Ariana Downs indicated that she is interested in undergoing NIPS.  Per ACOG recommendation, carrier screening for hemoglobinopathies, cystic fibrosis (CF) and spinal muscular atrophy (SMA) was discussed including information about the conditions, rationale for testing, autosomal recessive inheritance, and the option of prenatal diagnosis. Ariana Downs was unsure of whether or not carrier screening had already been ordered by her OBGYN provider. She was informed that select hemoglobinopathies and CF are included on [place] 's newborn screen, but that SMA currently is not included. We discussed the Early Check research study to add SMA to her baby's newborn screening panel. Ariana Downs indicated that she was interested in pursuing this, so she was given written information in Spanish on how to enroll in the Early Check study.   A limited ultrasound was performed today prior to our visit. The ultrasound report will be sent under separate cover. There were no visualized fetal anomalies or markers suggestive of aneuploidy. Nuchal translucency measurement was normal.  Ms.  Downs was also counseled regarding the option of diagnostic testing via chorionic villus sampling (CVS) or amniocentesis. We discussed the technical aspects of each procedure and quoted up to a 1 in 500 (0.2%) risk for spontaneous pregnancy loss or other adverse pregnancy outcomes as a result of either procedure. Cultured cells from either a placental or amniotic fluid sample allow for the visualization of a fetal karyotype, which can detect >99% of chromosomal aberrations. Chromosomal microarray can also be performed to identify smaller deletions or duplications of fetal chromosomal material. After careful consideration, Ms. Downs declined diagnostic testing at this time. She understands that diagnostic testing is available at any point through the end of pregnancy and that she may opt to undergo the procedure at a later date should she change her mind.  Lastly, screening for open neural tube defects (ONTDs) via MS-AFP in the second trimester in addition to level II ultrasound examination is recommended. Level II ultrasound and MS-AFP are able to detect ONTDs with 90-95% sensitivity. However, normal results from any of the above options do not guarantee a normal baby, as 3-5% of newborns have some type of birth defect, many of which are not prenatally diagnosable.  Ms. Seaman had her blood drawn for MaterniT21 NIPS today. She indicated that her application for Medicaid is pending. Ms. Smyth agreed to send me her Medicaid information as soon as she is approved so that I can send her insurance information to the laboratory. Results from NIPS will take 5-7 days to be returned. I will call Ms. Downs when results become available.   I counseled Ms. Downs regarding the above risks and available options. The approximate face-to-face time  with the genetic counselor was 40 minutes.  In summary:  Discussed advanced maternal age and options for follow-up  testing  <1% chance of having a baby with a chromosomal aneuploidy based on age-related risk  Opted to undergo NIPS. She will send me her Medicaid information when she is approved. We will follow results  Discussed carrier screening for cystic fibrosis, spinal muscular atrophy, and hemoglobinopathies  Unsure if carrier screening was ordered by referring provider. If it has not been ordered and she is interested, she may contact me to facilitate testing  Provided information on Early Check study to add SMA to newborn screen  Reviewed results of limited ultrasound  No fetal anomalies or markers seen  Offered additional testing and screening  Declined diagnostic testing  Recommend MS-AFP screening at 16-18 weeks  Reviewed family history concerns   Buelah Manis, MS Genetic Counselor

## 2019-06-15 LAB — MATERNIT21 PLUS CORE+SCA
Fetal Fraction: 3
Monosomy X (Turner Syndrome): NOT DETECTED
Result (T21): NEGATIVE
Trisomy 13 (Patau syndrome): NEGATIVE
Trisomy 18 (Edwards syndrome): NEGATIVE
Trisomy 21 (Down syndrome): NEGATIVE
XXX (Triple X Syndrome): NOT DETECTED
XXY (Klinefelter Syndrome): NOT DETECTED
XYY (Jacobs Syndrome): NOT DETECTED

## 2019-06-17 ENCOUNTER — Telehealth (HOSPITAL_COMMUNITY): Payer: Self-pay | Admitting: Genetic Counselor

## 2019-06-17 NOTE — Telephone Encounter (Signed)
Attempted to call Ms. Ariana Downs two times with the help of [address] [place], [place] [phone], to discuss her negative noninvasive prenatal screening results. However, calls went to voicemail and mailbox was full so we could not leave message. Will try again tomorrow.  Gershon Crane, MS Genetic Counselor

## 2019-06-18 ENCOUNTER — Telehealth (HOSPITAL_COMMUNITY): Payer: Self-pay | Admitting: Genetic Counselor

## 2019-06-18 NOTE — Telephone Encounter (Signed)
Attempted to call Ariana Downs two times with the help of [address] [place], [place] [phone], to discuss her negative noninvasive prenatal screening results. However, voicemail was still full so we were unable to leave a message.  Gershon Crane, MS Genetic Counselor

## 2019-07-11 ENCOUNTER — Other Ambulatory Visit: Payer: Self-pay

## 2019-07-11 ENCOUNTER — Inpatient Hospital Stay (HOSPITAL_COMMUNITY)
Admission: AD | Admit: 2019-07-11 | Discharge: 2019-07-11 | Disposition: A | Discharge status: Home / Self Care | Payer: Self-pay | Attending: Family Medicine | Admitting: Family Medicine

## 2019-07-11 ENCOUNTER — Encounter (HOSPITAL_COMMUNITY): Payer: Self-pay | Admitting: Obstetrics and Gynecology

## 2019-07-11 DIAGNOSIS — R1012 Left upper quadrant pain: Secondary | ICD-10-CM | POA: Insufficient documentation

## 2019-07-11 DIAGNOSIS — Z3A18 18 weeks gestation of pregnancy: Secondary | ICD-10-CM | POA: Insufficient documentation

## 2019-07-11 DIAGNOSIS — M7918 Myalgia, other site: Secondary | ICD-10-CM | POA: Insufficient documentation

## 2019-07-11 DIAGNOSIS — O26892 Other specified pregnancy related conditions, second trimester: Secondary | ICD-10-CM | POA: Insufficient documentation

## 2019-07-11 LAB — CBC
HCT: 33.7 % — ABNORMAL LOW (ref 36.0–46.0)
Hemoglobin: 11.1 g/dL — ABNORMAL LOW (ref 12.0–15.0)
MCH: 29.4 pg (ref 26.0–34.0)
MCHC: 32.9 g/dL (ref 30.0–36.0)
MCV: 89.4 fL (ref 80.0–100.0)
Platelets: 338 10*3/uL (ref 150–400)
RBC: 3.77 MIL/uL — ABNORMAL LOW (ref 3.87–5.11)
RDW: 13.9 % (ref 11.5–15.5)
WBC: 10.9 10*3/uL — ABNORMAL HIGH (ref 4.0–10.5)
nRBC: 0 % (ref 0.0–0.2)

## 2019-07-11 LAB — BASIC METABOLIC PANEL
Anion gap: 7 (ref 5–15)
BUN: 10 mg/dL (ref 6–20)
CO2: 23 mmol/L (ref 22–32)
Calcium: 9.1 mg/dL (ref 8.9–10.3)
Chloride: 109 mmol/L (ref 98–111)
Creatinine, Ser: 1.1 mg/dL — ABNORMAL HIGH (ref 0.44–1.00)
GFR calc Af Amer: >60 mL/min (ref >60)
GFR calc non Af Amer: >60 mL/min (ref >60)
Glucose, Bld: 103 mg/dL — ABNORMAL HIGH (ref 70–99)
Potassium: 4.4 mmol/L (ref 3.5–5.1)
Sodium: 139 mmol/L (ref 135–145)

## 2019-07-11 LAB — URINALYSIS, ROUTINE W REFLEX MICROSCOPIC
Bilirubin Urine: NEGATIVE
Glucose, UA: NEGATIVE mg/dL
Hgb urine dipstick: NEGATIVE
Ketones, ur: NEGATIVE mg/dL
Nitrite: NEGATIVE
Protein, ur: 100 mg/dL — AB
Specific Gravity, Urine: 1.019 (ref 1.005–1.030)
WBC, UA: >50 WBC/hpf — ABNORMAL HIGH (ref 0–5)
pH: 6 (ref 5.0–8.0)

## 2019-07-11 LAB — LIPASE, BLOOD: Lipase: 45 U/L (ref 11–51)

## 2019-07-11 MED ORDER — ALUM & MAG HYDROXIDE-SIMETH 200-200-20 MG/5ML PO SUSP
30.0000 mL | Freq: Once | ORAL | Status: AC
  Administered 2019-07-11: 17:00:00 30 mL via ORAL
  Filled 2019-07-11: qty 30

## 2019-07-11 MED ORDER — LIDOCAINE VISCOUS HCL 2 % MT SOLN
15.0000 mL | Freq: Once | OROMUCOSAL | Status: AC
Start: 2019-07-11 — End: 2019-07-11
  Administered 2019-07-11: 15 mL via ORAL
  Filled 2019-07-11: qty 15

## 2019-07-11 MED ORDER — CYCLOBENZAPRINE HCL 10 MG PO TABS: 10.0000 mg | ORAL_TABLET | Freq: Three times a day (TID) | ORAL | 0 refills | Status: DC | PRN

## 2019-07-11 MED ORDER — CYCLOBENZAPRINE HCL 10 MG PO TABS
10.0000 mg | ORAL_TABLET | Freq: Three times a day (TID) | ORAL | Status: DC | PRN
  Administered 2019-07-11: 17:00:00 10 mg via ORAL
  Filled 2019-07-11: qty 1

## 2019-07-11 NOTE — MAU Note (Signed)
Pt fell two weeks ago and fell on her knees. Did not come to hospital.

## 2019-07-11 NOTE — MAU Note (Signed)
Having a strong pain under her ribs on left side, started 2 days ago.  When she breaths it hurts worse. Denies cough or fever.

## 2019-07-11 NOTE — Discharge Instructions (Signed)
Dolor musculoesquelticoCuando Chief Technology Officer es intenso Musculoskeletal Pain "Dolor musculoesqueltico" hace referencia a los dolores y las [employer], las articulaciones, los msculos y los tejidos que los rodean. Este dolor puede ocurrir en cualquier parte del cuerpo. Puede durar un breve perodo (agudo) o prolongarse mucho tiempo (crnico). Es posible que se realicen un examen fsico, anlisis de laboratorio y estudios de diagnstico por imgenes para Veterinary surgeon causa del dolor musculoesqueltico. Siga estas indicaciones en su casa:  Estilo de vida  Trate de controlar o reducir los niveles de estrs. El estrs aumenta la tensin muscular y Engineer, production musculoesqueltico. Es importante reconocer cuando est ansioso o estresado y aprender distintas formas de Paediatric nurse. Estas pueden incluir, entre [place], las siguientes: ? Yoga o meditacin. ? Terapia cognitiva o conductual. ? Acupuntura o terapia de masajes.  Podr seguir con todas las actividades, a menos que Banker generen ms [employer]. Cuando el dolor Robertson, retome las actividades habituales de a poco. Aumente gradualmente la intensidad y la duracin de las actividades o del ejercicio que realice. Control del dolor, la rigidez y Energy manager los medicamentos de venta libre y los recetados solamente como se lo haya indicado el mdico.  Si el dolor es intenso, el reposo en cama puede ser beneficioso. Acustese o sintese en cualquier posicin que sea cmoda, pero salga de la cama y camine al [employer].  Si se lo indican, aplique calor en la zona afectada con la frecuencia que le haya indicado el mdico. Use la fuente de calor que el mdico le recomiende, como una compresa de calor hmedo o una almohadilla trmica. ? Coloque una [employer] piel y la fuente de Airline pilot. ? Aplique el calor durante un perodo de 20a68minutos. ? Retire la fuente de calor si la piel se pone de color rojo  brillante. Esto es especialmente importante si no puede sentir dolor, calor o fro. Puede correr un riesgo mayor de sufrir quemaduras.  Si se lo indican, aplique hielo sobre la zona dolorida. ? Ponga el hielo en una bolsa plstica. ? Coloque una [employer] piel y la bolsa de hielo. ? Coloque el hielo durante , de 2a3veces por da. Instrucciones generales  El mdico puede recomendarle que consulte a un fisioterapeuta. Esta persona puede ayudarlo a elaborar un programa de ejercicios seguro. Haga ejercicios como se lo haya indicado el fisioterapeuta.  Concurra a todas las visitas de control, incluidas las sesiones de fisioterapia, como se lo hayan indicado los mdicos. Esto es importante. Comunquese con un mdico si:  El Product/process development scientist.  Los medicamentos no Associate Professor.  No puede usar la parte del cuerpo que le duele, como un brazo, una pierna o el cuello.  Tiene dificultad para dormir.  Tiene dificultad para [employer] cotidianas. Solicite ayuda de inmediato si:  Tiene una nueva lesin o el dolor empeora o es diferente.  Tiene adormecimiento u hormigueo en la zona dolorida. Resumen  "Dolor musculoesqueltico" hace referencia a los dolores y las [employer], las articulaciones, los msculos y los tejidos [employer] rodean.  Este dolor puede ocurrir en cualquier parte del cuerpo.  El mdico puede recomendarle que consulte a un fisioterapeuta. Esta persona puede ayudarlo a elaborar un programa de ejercicios seguro. Haga ejercicios como se lo haya indicado el fisioterapeuta.  Disminuya su nivel de estrs. El estrs puede Administrator, arts musculoesqueltico. [place] los mtodos para disminuir el estrs se pueden  mencionar la [employer], el yoga, la terapia cognitiva o conductual, la acupuntura y la terapia de [place]. Esta informacin no tiene Marine scientist el consejo del mdico. Asegrese de hacerle al mdico cualquier pregunta que  tenga. Document Revised: 02/20/2017 Document Reviewed: 02/20/2017 Elsevier Patient Education  [place].

## 2019-07-11 NOTE — MAU Note (Signed)
Not enough urine for culture tube 

## 2019-07-11 NOTE — MAU Provider Note (Signed)
History     CSN: [id number]  Arrival date and time: 07/11/19 1520   First Provider Initiated Contact with Patient 07/11/19 1655      Chief Complaint  Patient presents with  . Abdominal Pain   34 y.o. [id number] @18 .2 wks presenting with LUQ pain. Sx started 2 days ago. Describes as sharp, constant, and worse with deep inspiration. Denies cough, SOB, or CP. Denies GI sx. No recent lifting or strenuous activity. Reports falling to her knees about 2 weeks ago. Denies pregnancy complaints. +FM.   OB History    Gravida  4   Para  2   Term  2   Preterm      AB  1   Living  2     SAB  1   TAB      Ectopic      Multiple      Live Births              Past Medical History:  Diagnosis Date  . Headache   . Vertigo     History reviewed. No pertinent surgical history.  Family History  Problem Relation Age of Onset  . Diabetes Mother     Social History   Tobacco Use  . Smoking status: Never Smoker  . Smokeless tobacco: Never Used  Substance Use Topics  . Alcohol use: No  . Drug use: No    Allergies: No Known Allergies  No medications prior to admission.    Review of Systems  Constitutional: Negative for chills and fever.  Respiratory: Negative for cough and shortness of breath.   Gastrointestinal: Positive for abdominal pain (LUQ). Negative for constipation, diarrhea, nausea and vomiting.  Genitourinary: Negative for dysuria, frequency, hematuria, urgency, vaginal bleeding and vaginal discharge.  Musculoskeletal: Negative for back pain.   Physical Exam   Blood pressure 133/79, pulse 95, temperature 99.3 F (37.4 C), temperature source Oral, resp. rate 18, height 5\' 1"  (1.549 m), weight 99.3 kg, last menstrual period 03/05/2019, SpO2 100 %.  Physical Exam  Nursing note and vitals reviewed. Constitutional: She is oriented to person, place, and time. She appears well-developed and well-nourished. No distress.  HENT:  Head: Normocephalic and  atraumatic.  Cardiovascular: Normal rate.  Respiratory: Effort normal. No respiratory distress.  GI: Soft. She exhibits no distension and no mass. There is no abdominal tenderness. There is no rebound and no guarding.  Musculoskeletal:        General: Normal range of motion.     Cervical back: Normal range of motion.  Neurological: She is alert and oriented to person, place, and time.  Skin: Skin is warm and dry.  Psychiatric: She has a normal mood and affect.  FHT 144  Results for orders placed or performed during the hospital encounter of 07/11/19 (from the past 24 hour(s))  Urinalysis, Routine w reflex microscopic     Status: Abnormal   Collection Time: 07/11/19  4:05 PM  Result Value Ref Range   Color, Urine YELLOW YELLOW   APPearance CLOUDY (A) CLEAR   Specific Gravity, Urine 1.019 1.005 - 1.030   pH 6.0 5.0 - 8.0   Glucose, UA NEGATIVE NEGATIVE mg/dL   Hgb urine dipstick NEGATIVE NEGATIVE   Bilirubin Urine NEGATIVE NEGATIVE   Ketones, ur NEGATIVE NEGATIVE mg/dL   Protein, ur 09/08/19 (A) NEGATIVE mg/dL   Nitrite NEGATIVE NEGATIVE   Leukocytes,Ua LARGE (A) NEGATIVE   RBC / HPF 6-10 0 - 5 RBC/hpf   WBC, UA >50 (  H) 0 - 5 WBC/hpf   Bacteria, UA MANY (A) NONE SEEN   Squamous Epithelial / LPF 21-50 0 - 5   Mucus PRESENT    Non Squamous Epithelial 0-5 (A) NONE SEEN  CBC     Status: Abnormal   Collection Time: 07/11/19  5:18 PM  Result Value Ref Range   WBC 10.9 (H) 4.0 - 10.5 K/uL   RBC 3.77 (L) 3.87 - 5.11 MIL/uL   Hemoglobin 11.1 (L) 12.0 - 15.0 g/dL   HCT 33.7 (L) 36.0 - 46.0 %   MCV 89.4 80.0 - 100.0 fL   MCH 29.4 26.0 - 34.0 pg   MCHC 32.9 30.0 - 36.0 g/dL   RDW 13.9 11.5 - 15.5 %   Platelets 338 150 - 400 K/uL   nRBC 0.0 0.0 - 0.2 %  Basic metabolic panel     Status: Abnormal   Collection Time: 07/11/19  5:18 PM  Result Value Ref Range   Sodium 139 135 - 145 mmol/L   Potassium 4.4 3.5 - 5.1 mmol/L   Chloride 109 98 - 111 mmol/L   CO2 23 22 - 32 mmol/L   Glucose,  Bld 103 (H) 70 - 99 mg/dL   BUN 10 6 - 20 mg/dL   Creatinine, Ser 1.10 (H) 0.44 - 1.00 mg/dL   Calcium 9.1 8.9 - 10.3 mg/dL   GFR calc non Af Amer >60 >60 mL/min   GFR calc Af Amer >60 >60 mL/min   Anion gap 7 5 - 15  Lipase, blood     Status: None   Collection Time: 07/11/19  5:18 PM  Result Value Ref Range   Lipase 45 11 - 51 U/L   MAU Course  Procedures Meds ordered this encounter  Medications  . AND Linked Order Group   . alum & mag hydroxide-simeth (MAALOX/MYLANTA) 200-200-20 MG/5ML suspension 30 mL   . lidocaine (XYLOCAINE) 2 % viscous mouth solution 15 mL  . cyclobenzaprine (FLEXERIL) tablet 10 mg  . cyclobenzaprine (FLEXERIL) 10 MG tablet    Sig: Take 1 tablet (10 mg total) by mouth 3 (three) times daily as needed (muscle pain).    Dispense:  30 tablet    Refill:  0    Order Specific Question:   Supervising Provider    Answer:   CONSTANT, [place] ordered and reviewed. Pain improved after meds. Labs normal except mildly elevated Ct (hx of this in 2015). UA with leuks, bacteria, and WBCs, pt asymptomatic, will send UC. Suspect pain is MSK, discussed treatment measures. Stable for discharge home.   Assessment and Plan   1. [redacted] weeks gestation of pregnancy   2. Musculoskeletal pain    Discharge home Follow up at [facility] as scheduled SAB precautions Return for worsening pain, CP, or SOB Rx Flexeril Heating pad prn  Allergies as of 07/11/2019   No Known Allergies     Medication List    TAKE these medications   acetaminophen 500 MG tablet Commonly known as: TYLENOL Take 1,000 mg by mouth every 6 (six) hours as needed for moderate pain or headache.   cyclobenzaprine 10 MG tablet Commonly known as: FLEXERIL Take 1 tablet (10 mg total) by mouth 3 (three) times daily as needed (muscle pain).   prenatal vitamin w/FE, FA 27-1 MG Tabs tablet Take 1 tablet by mouth daily at 12 noon.      Live interpreter present for this encounter  Julianne Handler,  CNM 07/11/2019, 7:08 PM

## 2019-07-12 LAB — CULTURE, OB URINE

## 2019-07-22 ENCOUNTER — Other Ambulatory Visit (HOSPITAL_COMMUNITY): Payer: Self-pay | Admitting: *Deleted

## 2019-07-22 ENCOUNTER — Encounter (HOSPITAL_COMMUNITY): Payer: Self-pay

## 2019-07-22 ENCOUNTER — Ambulatory Visit (HOSPITAL_COMMUNITY): Payer: Self-pay | Admitting: *Deleted

## 2019-07-22 ENCOUNTER — Ambulatory Visit (HOSPITAL_COMMUNITY)
Admission: RE | Admit: 2019-07-22 | Discharge: 2019-07-22 | Disposition: A | Discharge status: Home / Self Care | Payer: Self-pay | Source: Ambulatory Visit | Attending: Obstetrics | Admitting: Obstetrics

## 2019-07-22 ENCOUNTER — Other Ambulatory Visit: Payer: Self-pay

## 2019-07-22 VITALS — BP 102/56 | Temp 98.1°F

## 2019-07-22 DIAGNOSIS — Z3A19 19 weeks gestation of pregnancy: Secondary | ICD-10-CM

## 2019-07-22 DIAGNOSIS — O09529 Supervision of elderly multigravida, unspecified trimester: Secondary | ICD-10-CM | POA: Insufficient documentation

## 2019-07-22 DIAGNOSIS — O43199 Other malformation of placenta, unspecified trimester: Secondary | ICD-10-CM

## 2019-07-22 DIAGNOSIS — O09522 Supervision of elderly multigravida, second trimester: Secondary | ICD-10-CM

## 2019-07-22 DIAGNOSIS — O99212 Obesity complicating pregnancy, second trimester: Secondary | ICD-10-CM

## 2019-07-22 DIAGNOSIS — O43192 Other malformation of placenta, second trimester: Secondary | ICD-10-CM

## 2019-08-19 ENCOUNTER — Other Ambulatory Visit (HOSPITAL_COMMUNITY): Payer: Self-pay | Admitting: *Deleted

## 2019-08-19 ENCOUNTER — Other Ambulatory Visit: Payer: Self-pay

## 2019-08-19 ENCOUNTER — Ambulatory Visit (HOSPITAL_COMMUNITY): Payer: Self-pay | Admitting: *Deleted

## 2019-08-19 ENCOUNTER — Ambulatory Visit (HOSPITAL_COMMUNITY)
Admission: RE | Admit: 2019-08-19 | Discharge: 2019-08-19 | Disposition: A | Discharge status: Home / Self Care | Payer: Self-pay | Source: Ambulatory Visit | Attending: Obstetrics and Gynecology | Admitting: Obstetrics and Gynecology

## 2019-08-19 ENCOUNTER — Encounter (HOSPITAL_COMMUNITY): Payer: Self-pay

## 2019-08-19 VITALS — BP 120/69 | HR 89 | Temp 97.3°F

## 2019-08-19 DIAGNOSIS — O09522 Supervision of elderly multigravida, second trimester: Secondary | ICD-10-CM

## 2019-08-19 DIAGNOSIS — O43199 Other malformation of placenta, unspecified trimester: Secondary | ICD-10-CM

## 2019-08-19 DIAGNOSIS — O0990 Supervision of high risk pregnancy, unspecified, unspecified trimester: Secondary | ICD-10-CM

## 2019-08-19 DIAGNOSIS — O99212 Obesity complicating pregnancy, second trimester: Secondary | ICD-10-CM

## 2019-08-19 DIAGNOSIS — O43192 Other malformation of placenta, second trimester: Secondary | ICD-10-CM

## 2019-08-19 DIAGNOSIS — Z363 Encounter for antenatal screening for malformations: Secondary | ICD-10-CM

## 2019-08-19 DIAGNOSIS — Z3A23 23 weeks gestation of pregnancy: Secondary | ICD-10-CM

## 2019-08-19 NOTE — Progress Notes (Signed)
Pt reports leaking a small amount of fluid with sneezing the last few days. She is unsure if it is urine.

## 2019-09-17 ENCOUNTER — Other Ambulatory Visit: Payer: Self-pay

## 2019-09-17 ENCOUNTER — Ambulatory Visit (HOSPITAL_COMMUNITY): Payer: Self-pay | Admitting: *Deleted

## 2019-09-17 ENCOUNTER — Ambulatory Visit (HOSPITAL_COMMUNITY)
Admission: RE | Admit: 2019-09-17 | Discharge: 2019-09-17 | Disposition: A | Discharge status: Home / Self Care | Payer: Self-pay | Source: Ambulatory Visit | Attending: Obstetrics and Gynecology | Admitting: Obstetrics and Gynecology

## 2019-09-17 ENCOUNTER — Other Ambulatory Visit (HOSPITAL_COMMUNITY): Payer: Self-pay | Admitting: *Deleted

## 2019-09-17 ENCOUNTER — Encounter (HOSPITAL_COMMUNITY): Payer: Self-pay

## 2019-09-17 VITALS — BP 123/78 | HR 84 | Temp 97.2°F

## 2019-09-17 DIAGNOSIS — O43193 Other malformation of placenta, third trimester: Secondary | ICD-10-CM

## 2019-09-17 DIAGNOSIS — O43199 Other malformation of placenta, unspecified trimester: Secondary | ICD-10-CM | POA: Insufficient documentation

## 2019-09-17 DIAGNOSIS — Z3A28 28 weeks gestation of pregnancy: Secondary | ICD-10-CM

## 2019-09-17 DIAGNOSIS — O09523 Supervision of elderly multigravida, third trimester: Secondary | ICD-10-CM | POA: Insufficient documentation

## 2019-10-16 ENCOUNTER — Ambulatory Visit: Payer: Self-pay | Admitting: *Deleted

## 2019-10-16 ENCOUNTER — Other Ambulatory Visit: Payer: Self-pay

## 2019-10-16 ENCOUNTER — Other Ambulatory Visit: Payer: Self-pay | Admitting: *Deleted

## 2019-10-16 ENCOUNTER — Ambulatory Visit (HOSPITAL_COMMUNITY): Payer: Self-pay | Attending: Obstetrics and Gynecology

## 2019-10-16 VITALS — BP 119/76 | HR 100 | Temp 97.1°F

## 2019-10-16 DIAGNOSIS — O0990 Supervision of high risk pregnancy, unspecified, unspecified trimester: Secondary | ICD-10-CM | POA: Insufficient documentation

## 2019-10-16 DIAGNOSIS — Z363 Encounter for antenatal screening for malformations: Secondary | ICD-10-CM

## 2019-10-16 DIAGNOSIS — E669 Obesity, unspecified: Secondary | ICD-10-CM

## 2019-10-16 DIAGNOSIS — O99213 Obesity complicating pregnancy, third trimester: Secondary | ICD-10-CM

## 2019-10-16 DIAGNOSIS — O43193 Other malformation of placenta, third trimester: Secondary | ICD-10-CM

## 2019-10-16 DIAGNOSIS — O09523 Supervision of elderly multigravida, third trimester: Secondary | ICD-10-CM

## 2019-10-16 DIAGNOSIS — Z3A32 32 weeks gestation of pregnancy: Secondary | ICD-10-CM

## 2019-10-17 ENCOUNTER — Other Ambulatory Visit: Payer: Self-pay

## 2019-10-17 ENCOUNTER — Encounter (HOSPITAL_COMMUNITY): Payer: Self-pay | Admitting: Obstetrics & Gynecology

## 2019-10-17 ENCOUNTER — Inpatient Hospital Stay (HOSPITAL_COMMUNITY)
Admission: AD | Admit: 2019-10-17 | Discharge: 2019-10-17 | Disposition: A | Discharge status: Home / Self Care | Payer: Self-pay | Attending: Obstetrics & Gynecology | Admitting: Obstetrics & Gynecology

## 2019-10-17 DIAGNOSIS — R030 Elevated blood-pressure reading, without diagnosis of hypertension: Secondary | ICD-10-CM | POA: Insufficient documentation

## 2019-10-17 DIAGNOSIS — O26893 Other specified pregnancy related conditions, third trimester: Secondary | ICD-10-CM | POA: Insufficient documentation

## 2019-10-17 DIAGNOSIS — O1213 Gestational proteinuria, third trimester: Secondary | ICD-10-CM | POA: Insufficient documentation

## 2019-10-17 DIAGNOSIS — Z3A32 32 weeks gestation of pregnancy: Secondary | ICD-10-CM | POA: Insufficient documentation

## 2019-10-17 LAB — URINALYSIS, ROUTINE W REFLEX MICROSCOPIC
Bilirubin Urine: NEGATIVE
Glucose, UA: NEGATIVE mg/dL
Hgb urine dipstick: NEGATIVE
Ketones, ur: NEGATIVE mg/dL
Leukocytes,Ua: NEGATIVE
Nitrite: NEGATIVE
Protein, ur: 100 mg/dL — AB
Specific Gravity, Urine: 1.01 (ref 1.005–1.030)
pH: 7 (ref 5.0–8.0)

## 2019-10-17 LAB — COMPREHENSIVE METABOLIC PANEL
ALT: 13 U/L (ref 0–44)
AST: 15 U/L (ref 15–41)
Albumin: 2.6 g/dL — ABNORMAL LOW (ref 3.5–5.0)
Alkaline Phosphatase: 77 U/L (ref 38–126)
Anion gap: 6 (ref 5–15)
BUN: 8 mg/dL (ref 6–20)
CO2: 23 mmol/L (ref 22–32)
Calcium: 9.1 mg/dL (ref 8.9–10.3)
Chloride: 107 mmol/L (ref 98–111)
Creatinine, Ser: 1.23 mg/dL — ABNORMAL HIGH (ref 0.44–1.00)
GFR calc Af Amer: >60 mL/min (ref >60)
GFR calc non Af Amer: 57 mL/min — ABNORMAL LOW (ref >60)
Glucose, Bld: 144 mg/dL — ABNORMAL HIGH (ref 70–99)
Potassium: 4.4 mmol/L (ref 3.5–5.1)
Sodium: 136 mmol/L (ref 135–145)
Total Bilirubin: 0.6 mg/dL (ref 0.3–1.2)
Total Protein: 5.9 g/dL — ABNORMAL LOW (ref 6.5–8.1)

## 2019-10-17 LAB — CBC
HCT: 34.8 % — ABNORMAL LOW (ref 36.0–46.0)
Hemoglobin: 11.1 g/dL — ABNORMAL LOW (ref 12.0–15.0)
MCH: 29 pg (ref 26.0–34.0)
MCHC: 31.9 g/dL (ref 30.0–36.0)
MCV: 90.9 fL (ref 80.0–100.0)
Platelets: 257 10*3/uL (ref 150–400)
RBC: 3.83 MIL/uL — ABNORMAL LOW (ref 3.87–5.11)
RDW: 14.4 % (ref 11.5–15.5)
WBC: 9.8 10*3/uL (ref 4.0–10.5)
nRBC: 0 % (ref 0.0–0.2)

## 2019-10-17 LAB — PROTEIN / CREATININE RATIO, URINE
Creatinine, Urine: 92.29 mg/dL
Protein Creatinine Ratio: 0.65 mg/mg{Cre} — ABNORMAL HIGH (ref 0.00–0.15)
Total Protein, Urine: 60 mg/dL

## 2019-10-17 NOTE — Discharge Instructions (Signed)
Cmo tomarse la presin arterial How to Take Your Blood Pressure La presin arterial es la medida de la fuerza de la sangre al presionar contra las paredes de las arterias. Las arterias son los vasos sanguneos que transportan la sangre desde el corazn hacia todas las partes del cuerpo. Su mdico toma su presin arterial en cada visita al consultorio. Usted tambin puede tomar su presin arterial en casa con un aparato de medicin de la presin arterial. Es posible que deba tomar su propia presin arterial:  Para confirmar un diagnstico de presin arterial elevada (hipertensin).  Para controlar su presin arterial a lo largo del tiempo.  Para asegurarse de que el medicamento para la presin arterial est surtiendo Engineer, mining. Materiales necesarios: Para tomar su presin arterial, necesitar un aparato de medicin de la presin arterial. Puede comprar un aparato de medicin de la presin arterial, o tensimetro, en la mayora de las farmacias o en lnea. Existen varios tipos de tensimetros para Cabin crew. Al escoger uno, considere lo siguiente:  Escoja un tensimetro que tenga un brazalete.  Escoja un brazalete que envuelva ceidamente la parte superior de su brazo. Debe poder meter nicamente un dedo entre el brazalete y Cabin crew.  No escoja un tensimetro que mida su presin arterial en la mueca o el dedo. Su mdico puede sugerirle un tensimetro confiable que cumpla con sus necesidades. Cmo prepararse Para obtener la lectura ms precisa, evite realizar lo siguiente durante los 30 minutos previos a Chief Operating Officer su presin arterial:  Beber cafena.  Consumir alcohol.  Comer.  Fumar.  Realizar actividad fsica. Cinco minutos antes de controlar su presin arterial:  Vace la vejiga.  Sintese tranquilo en una silla de comedor y no en un silln blando o sof. Cmo tomarse la presin arterial Para controlar su presin arterial, siga las instrucciones presentes en el manual que se  incluye con el tensimetro. Si tiene Ambulance person, las instrucciones podran ser las siguientes: 1. Sintese con la espalda recta. 2. Coloque los pies en el piso. No cruce los tobillos o las piernas. 3. Apoye su brazo izquierdo al nivel de su corazn en una mesa o escritorio, o en el brazo de la silla. 4. Arremnguese. 5. Envuelva la parte superior de su brazo izquierdo, 1 pulgada (2,5 cm) sobre su codo, con el brazalete. Es mejor Optometrist brazalete alrededor de la piel [place]. 6. Ajuste el brazalete alrededor de su brazo. Debe poder meter nicamente un dedo entre el brazalete y Cabin crew. 7. Coloque el cable dentro del surco de su codo. 8. Presione el botn de encendido. 9. Permanezca sentado tranquilamente mientras el brazalete se infla y se desinfla. 10. Lea la lectura digital que aparece en la pantalla del tensimetro y antela (regstrela). 11. Espere de 2a3 minutos, y luego repita los pasos desde el paso 1. Qu significa mi lectura de presin arterial? Una lectura de la presin arterial consta de un nmero ms alto sobre un nmero ms bajo. En condiciones ideales, la presin arterial debe estar por debajo de 120/80. El primer nmero ("superior") es la presin sistlica. Es la medida de la presin de las arterias cuando el corazn late. El segundo nmero ("inferior") es la presin diastlica. Es la medida de la presin en las arterias cuando el corazn se relaja. La presin arterial se clasifica en cuatro etapas. Las siguientes son las etapas para adultos que no tienen enfermedad grave de corto plazo o una afeccin crnica. La presin sistlica y la presin diastlica se  miden en una unidad llamada mm Hg. Normal  Presin sistlica: por debajo de 120.  Presin diastlica: por debajo de 80. Elevada  Presin sistlica: 120-129.  Presin diastlica: por debajo de 80. Etapa 1 de hipertensin  Presin sistlica: 130-139.  Presin diastlica: 80-89. Etapa 2 de  hipertensin  Presin sistlica: 140 o ms.  Presin diastlica: 90 o ms. Puede tener prehipertensin o hipertensin incluso si nicamente el nmero sistlico o el diastlico de su lectura es ms elevado que lo normal. Siga estas indicaciones en su casa:  Mida su presin arterial con la frecuencia recomendada por su mdico.  Lleve el tensimetro a su prxima cita con el mdico para asegurarse de lo siguiente: ? Que lo usa correctamente. ? Que genera lecturas precisas.  Asegrese de entender cules son sus objetivos para la presin arterial.  Dgale a su mdico si tiene efectos secundarios causados por los medicamentos para la presin arterial. Comunquese con un mdico si:  Su presin arterial es sistemticamente alta. Solicite ayuda de inmediato si:  Su presin arterial sistlica est por encima de 180.  Su presin arterial diastlica est por encima de 110. Esta informacin no tiene como fin reemplazar el consejo del mdico. Asegrese de hacerle al mdico cualquier pregunta que tenga. Document Revised: 09/02/2017 Document Reviewed: 10/30/2015 Elsevier Patient Education  2020 Elsevier Inc.  

## 2019-10-17 NOTE — MAU Note (Signed)
.   Ariana Downs is a 34 y.o. at [redacted]w[redacted]d here in MAU reporting: she was sent from office for an increase in blood pressure. Reports mild headache  Onset of complaint: roday Pain score:4 Vitals:   10/17/19 1232  BP: 136/78  Pulse: (!) 121  Resp: 18  Temp: 98.2 F (36.8 C)     FHT:135 Lab orders placed from triage: UA

## 2019-10-17 NOTE — MAU Provider Note (Addendum)
Patient Ariana Downs is a 34 y.o. [id number]  at [redacted]w[redacted]d here for rule-out pre-eclampsia based on elevated blood pressures at the [facility] prenatal visit today. She denies current HA, blurry vision, decreased fetal movements, vaginal bleeding, vaginal discharge. She endorses some dysuria.   She has had NSVD in the past; all term deliveries.   She reports that she has a history of "protein in her urine" at the [facility] records. She states that she has had a history of migraines and had a HA yesterday but none today.  History     CSN: [id number]  Arrival date and time: 10/17/19 1204   First Provider Initiated Contact with Patient 10/17/19 1300      Chief Complaint  Patient presents with  . Hypertension   Hypertension This is a new problem. The current episode started today. The problem has been resolved since onset. Pertinent negatives include no chest pain or shortness of breath.  She denies HA, blurry vision, floating spots, RUQ pain or other signs of pre-eclampsia.   OB History    Gravida  4   Para  2   Term  2   Preterm      AB  1   Living  2     SAB  1   TAB      Ectopic      Multiple      Live Births              Past Medical History:  Diagnosis Date  . Headache   . Vertigo     Past Surgical History:  Procedure Laterality Date  . NO PAST SURGERIES      Family History  Problem Relation Age of Onset  . Diabetes Mother     Social History   Tobacco Use  . Smoking status: Never Smoker  . Smokeless tobacco: Never Used  Substance Use Topics  . Alcohol use: No  . Drug use: No    Allergies: No Known Allergies  Medications Prior to Admission  Medication Sig Dispense Refill Last Dose  . acetaminophen (TYLENOL) 500 MG tablet Take 1,000 mg by mouth every 6 (six) hours as needed for moderate pain or headache.   10/16/2019 at Unknown time  . prenatal vitamin w/FE, FA (PRENATAL 1 + 1) 27-1 MG TABS tablet Take 1 tablet by mouth daily at 12 noon.    10/16/2019 at Unknown time  . cyclobenzaprine (FLEXERIL) 10 MG tablet Take 1 tablet (10 mg total) by mouth 3 (three) times daily as needed (muscle pain). (Patient not taking: Reported on 07/22/2019) 30 tablet 0 More than a month at Unknown time    Review of Systems  Respiratory: Negative for shortness of breath.   Cardiovascular: Negative for chest pain.   Physical Exam   Blood pressure 136/78, pulse (!) 121, temperature 98.2 F (36.8 C), resp. rate 18, last menstrual period 03/05/2019.  Physical Exam  Constitutional: She appears well-developed.  HENT:  Head: Normocephalic.  Respiratory: Effort normal.  GI: Soft.  Musculoskeletal:        General: Normal range of motion.     Cervical back: Normal range of motion.  Neurological: She is alert.  Skin: Skin is warm and dry.    MAU Course  Procedures  MDM -NST: 130 bpm, mod var, present acel, no decels, no contractions, NST reactive -will draw pre-e labs> -pre e labs are normal; discussed with attending who recommends routine care with return precautions.  Assessment and Plan   1.  Proteinuria affecting pregnancy in third trimester    2. Patient stable for discharge with recommendation to keep [facility] visit in two weeks. Patient should have follow up with nephrology after delivery  3. Patient will purchase blood pressure cuff and check blood pressure. Reviewed strict warning signs and when to return to MAU. Will send urine for culture  4. All questions answered.  Mervyn Skeeters Vear Staton 10/17/2019, 1:04 PM

## 2019-11-14 ENCOUNTER — Other Ambulatory Visit: Payer: Self-pay

## 2019-11-14 ENCOUNTER — Ambulatory Visit: Payer: Self-pay | Admitting: *Deleted

## 2019-11-14 ENCOUNTER — Ambulatory Visit: Payer: Self-pay | Attending: Obstetrics and Gynecology

## 2019-11-14 VITALS — BP 133/84 | HR 90

## 2019-11-14 DIAGNOSIS — O99213 Obesity complicating pregnancy, third trimester: Secondary | ICD-10-CM

## 2019-11-14 DIAGNOSIS — O43193 Other malformation of placenta, third trimester: Secondary | ICD-10-CM

## 2019-11-14 DIAGNOSIS — Z3A36 36 weeks gestation of pregnancy: Secondary | ICD-10-CM

## 2019-11-14 DIAGNOSIS — O0990 Supervision of high risk pregnancy, unspecified, unspecified trimester: Secondary | ICD-10-CM

## 2019-11-14 DIAGNOSIS — O09523 Supervision of elderly multigravida, third trimester: Secondary | ICD-10-CM | POA: Insufficient documentation

## 2019-11-14 DIAGNOSIS — Z362 Encounter for other antenatal screening follow-up: Secondary | ICD-10-CM

## 2019-11-14 DIAGNOSIS — E669 Obesity, unspecified: Secondary | ICD-10-CM

## 2019-11-24 ENCOUNTER — Inpatient Hospital Stay (HOSPITAL_COMMUNITY): Payer: Medicaid Other | Admitting: Anesthesiology

## 2019-11-24 ENCOUNTER — Encounter (HOSPITAL_COMMUNITY): Payer: Self-pay | Admitting: Obstetrics and Gynecology

## 2019-11-24 ENCOUNTER — Inpatient Hospital Stay (HOSPITAL_COMMUNITY)
Admission: AD | Admit: 2019-11-24 | Discharge: 2019-11-26 | DRG: 806 | Disposition: A | Discharge status: Home / Self Care | Payer: Medicaid Other | Attending: Obstetrics & Gynecology | Admitting: Obstetrics & Gynecology

## 2019-11-24 DIAGNOSIS — O1404 Mild to moderate pre-eclampsia, complicating childbirth: Secondary | ICD-10-CM | POA: Diagnosis present

## 2019-11-24 DIAGNOSIS — O43123 Velamentous insertion of umbilical cord, third trimester: Secondary | ICD-10-CM | POA: Diagnosis present

## 2019-11-24 DIAGNOSIS — N183 Chronic kidney disease, stage 3 unspecified: Secondary | ICD-10-CM | POA: Diagnosis present

## 2019-11-24 DIAGNOSIS — O368130 Decreased fetal movements, third trimester, not applicable or unspecified: Principal | ICD-10-CM | POA: Diagnosis present

## 2019-11-24 DIAGNOSIS — O26833 Pregnancy related renal disease, third trimester: Secondary | ICD-10-CM | POA: Diagnosis present

## 2019-11-24 DIAGNOSIS — Z3A37 37 weeks gestation of pregnancy: Secondary | ICD-10-CM

## 2019-11-24 DIAGNOSIS — O135 Gestational [pregnancy-induced] hypertension without significant proteinuria, complicating the puerperium: Secondary | ICD-10-CM | POA: Diagnosis not present

## 2019-11-24 DIAGNOSIS — O1214 Gestational proteinuria, complicating childbirth: Secondary | ICD-10-CM | POA: Diagnosis not present

## 2019-11-24 DIAGNOSIS — Z20822 Contact with and (suspected) exposure to covid-19: Secondary | ICD-10-CM | POA: Diagnosis present

## 2019-11-24 DIAGNOSIS — O139 Gestational [pregnancy-induced] hypertension without significant proteinuria, unspecified trimester: Secondary | ICD-10-CM | POA: Diagnosis present

## 2019-11-24 DIAGNOSIS — O1213 Gestational proteinuria, third trimester: Secondary | ICD-10-CM | POA: Diagnosis present

## 2019-11-24 DIAGNOSIS — N179 Acute kidney failure, unspecified: Secondary | ICD-10-CM

## 2019-11-24 DIAGNOSIS — O133 Gestational [pregnancy-induced] hypertension without significant proteinuria, third trimester: Secondary | ICD-10-CM

## 2019-11-24 LAB — CBC
HCT: 34.1 % — ABNORMAL LOW (ref 36.0–46.0)
HCT: 36.2 % (ref 36.0–46.0)
Hemoglobin: 11 g/dL — ABNORMAL LOW (ref 12.0–15.0)
Hemoglobin: 11.8 g/dL — ABNORMAL LOW (ref 12.0–15.0)
MCH: 28.9 pg (ref 26.0–34.0)
MCH: 29.4 pg (ref 26.0–34.0)
MCHC: 32.3 g/dL (ref 30.0–36.0)
MCHC: 32.6 g/dL (ref 30.0–36.0)
MCV: 89.7 fL (ref 80.0–100.0)
MCV: 90.3 fL (ref 80.0–100.0)
Platelets: 223 10*3/uL (ref 150–400)
Platelets: 246 10*3/uL (ref 150–400)
RBC: 3.8 MIL/uL — ABNORMAL LOW (ref 3.87–5.11)
RBC: 4.01 MIL/uL (ref 3.87–5.11)
RDW: 14.9 % (ref 11.5–15.5)
RDW: 14.9 % (ref 11.5–15.5)
WBC: 10.3 10*3/uL (ref 4.0–10.5)
WBC: 13.1 10*3/uL — ABNORMAL HIGH (ref 4.0–10.5)
nRBC: 0 % (ref 0.0–0.2)
nRBC: 0 % (ref 0.0–0.2)

## 2019-11-24 LAB — PROTEIN / CREATININE RATIO, URINE
Creatinine, Urine: 32.44 mg/dL
Protein Creatinine Ratio: 0.46 mg/mg{Cre} — ABNORMAL HIGH (ref 0.00–0.15)
Total Protein, Urine: 15 mg/dL

## 2019-11-24 LAB — URINALYSIS, ROUTINE W REFLEX MICROSCOPIC
Bilirubin Urine: NEGATIVE
Glucose, UA: NEGATIVE mg/dL
Hgb urine dipstick: NEGATIVE
Ketones, ur: NEGATIVE mg/dL
Leukocytes,Ua: NEGATIVE
Nitrite: NEGATIVE
Protein, ur: NEGATIVE mg/dL
Specific Gravity, Urine: 1.004 — ABNORMAL LOW (ref 1.005–1.030)
pH: 6 (ref 5.0–8.0)

## 2019-11-24 LAB — COMPREHENSIVE METABOLIC PANEL
ALT: 13 U/L (ref 0–44)
AST: 15 U/L (ref 15–41)
Albumin: 2.4 g/dL — ABNORMAL LOW (ref 3.5–5.0)
Alkaline Phosphatase: 99 U/L (ref 38–126)
Anion gap: 7 (ref 5–15)
BUN: 12 mg/dL (ref 6–20)
CO2: 20 mmol/L — ABNORMAL LOW (ref 22–32)
Calcium: 9.5 mg/dL (ref 8.9–10.3)
Chloride: 111 mmol/L (ref 98–111)
Creatinine, Ser: 1.16 mg/dL — ABNORMAL HIGH (ref 0.44–1.00)
GFR calc Af Amer: >60 mL/min (ref >60)
GFR calc non Af Amer: >60 mL/min (ref >60)
Glucose, Bld: 96 mg/dL (ref 70–99)
Potassium: 4.2 mmol/L (ref 3.5–5.1)
Sodium: 138 mmol/L (ref 135–145)
Total Bilirubin: 0.2 mg/dL — ABNORMAL LOW (ref 0.3–1.2)
Total Protein: 5.9 g/dL — ABNORMAL LOW (ref 6.5–8.1)

## 2019-11-24 LAB — NO BLOOD PRODUCTS

## 2019-11-24 LAB — ABO/RH: ABO/RH(D): O POS

## 2019-11-24 LAB — SARS CORONAVIRUS 2 BY RT PCR (HOSPITAL ORDER, PERFORMED IN ~~LOC~~ HOSPITAL LAB): SARS Coronavirus 2: NEGATIVE

## 2019-11-24 MED ORDER — OXYTOCIN-SODIUM CHLORIDE 30-0.9 UT/500ML-% IV SOLN: 1.0000 m[IU]/min | INTRAVENOUS | Status: DC

## 2019-11-24 MED ORDER — OXYTOCIN-SODIUM CHLORIDE 30-0.9 UT/500ML-% IV SOLN
2.5000 [IU]/h | INTRAVENOUS | Status: DC
  Filled 2019-11-24: qty 500

## 2019-11-24 MED ORDER — EPHEDRINE 5 MG/ML INJ: 10.0000 mg | INTRAVENOUS | Status: DC | PRN

## 2019-11-24 MED ORDER — LIDOCAINE HCL (PF) 1 % IJ SOLN: 30.0000 mL | INTRAMUSCULAR | Status: DC | PRN

## 2019-11-24 MED ORDER — DIPHENHYDRAMINE HCL 50 MG/ML IJ SOLN: 12.5000 mg | INTRAMUSCULAR | Status: DC | PRN

## 2019-11-24 MED ORDER — OXYCODONE-ACETAMINOPHEN 5-325 MG PO TABS: 2.0000 | ORAL_TABLET | ORAL | Status: DC | PRN

## 2019-11-24 MED ORDER — FENTANYL-BUPIVACAINE-NACL 0.5-0.125-0.9 MG/250ML-% EP SOLN
12.0000 mL/h | EPIDURAL | Status: DC | PRN
  Filled 2019-11-24: qty 250

## 2019-11-24 MED ORDER — MISOPROSTOL 50MCG HALF TABLET
50.0000 ug | ORAL_TABLET | ORAL | Status: DC
  Administered 2019-11-24: 50 ug via ORAL
  Filled 2019-11-24 (×2): qty 1

## 2019-11-24 MED ORDER — LACTATED RINGERS IV SOLN
500.0000 mL | INTRAVENOUS | Status: DC | PRN
  Administered 2019-11-24: 500 mL via INTRAVENOUS

## 2019-11-24 MED ORDER — PHENYLEPHRINE 40 MCG/ML (10ML) SYRINGE FOR IV PUSH (FOR BLOOD PRESSURE SUPPORT)
80.0000 ug | PREFILLED_SYRINGE | INTRAVENOUS | Status: DC | PRN
  Administered 2019-11-24 (×2): 80 ug via INTRAVENOUS

## 2019-11-24 MED ORDER — OXYTOCIN BOLUS FROM INFUSION
333.0000 mL | Freq: Once | INTRAVENOUS | Status: AC
  Administered 2019-11-25: 333 mL via INTRAVENOUS

## 2019-11-24 MED ORDER — SOD CITRATE-CITRIC ACID 500-334 MG/5ML PO SOLN
30.0000 mL | ORAL | Status: DC | PRN
  Administered 2019-11-25: 30 mL via ORAL
  Filled 2019-11-24: qty 30

## 2019-11-24 MED ORDER — ONDANSETRON HCL 4 MG/2ML IJ SOLN
4.0000 mg | Freq: Four times a day (QID) | INTRAMUSCULAR | Status: DC | PRN
  Filled 2019-11-24: qty 2

## 2019-11-24 MED ORDER — TERBUTALINE SULFATE 1 MG/ML IJ SOLN: 0.2500 mg | Freq: Once | INTRAMUSCULAR | Status: DC | PRN

## 2019-11-24 MED ORDER — SODIUM CHLORIDE (PF) 0.9 % IJ SOLN
INTRAMUSCULAR | Status: DC | PRN
  Administered 2019-11-24: 12 mL/h via EPIDURAL

## 2019-11-24 MED ORDER — ACETAMINOPHEN 325 MG PO TABS
650.0000 mg | ORAL_TABLET | ORAL | Status: DC | PRN
  Administered 2019-11-24 – 2019-11-25 (×2): 650 mg via ORAL
  Filled 2019-11-24 (×2): qty 2

## 2019-11-24 MED ORDER — OXYTOCIN-SODIUM CHLORIDE 30-0.9 UT/500ML-% IV SOLN
1.0000 m[IU]/min | INTRAVENOUS | Status: DC
  Administered 2019-11-24: 2 m[IU]/min via INTRAVENOUS

## 2019-11-24 MED ORDER — LACTATED RINGERS IV SOLN: INTRAVENOUS | Status: DC

## 2019-11-24 MED ORDER — OXYCODONE-ACETAMINOPHEN 5-325 MG PO TABS: 1.0000 | ORAL_TABLET | ORAL | Status: DC | PRN

## 2019-11-24 MED ORDER — LACTATED RINGERS IV SOLN
500.0000 mL | Freq: Once | INTRAVENOUS | Status: AC
  Administered 2019-11-24: 500 mL via INTRAVENOUS

## 2019-11-24 MED ORDER — PHENYLEPHRINE 40 MCG/ML (10ML) SYRINGE FOR IV PUSH (FOR BLOOD PRESSURE SUPPORT): 80.0000 ug | PREFILLED_SYRINGE | INTRAVENOUS | Status: DC | PRN

## 2019-11-24 MED ORDER — LACTATED RINGERS IV SOLN: 500.0000 mL | Freq: Once | INTRAVENOUS | Status: DC

## 2019-11-24 MED ORDER — LIDOCAINE HCL (PF) 1 % IJ SOLN
INTRAMUSCULAR | Status: DC | PRN
  Administered 2019-11-24: 5 mL via EPIDURAL

## 2019-11-24 MED ORDER — FENTANYL-BUPIVACAINE-NACL 0.5-0.125-0.9 MG/250ML-% EP SOLN: 12.0000 mL/h | EPIDURAL | Status: DC | PRN

## 2019-11-24 MED ORDER — PHENYLEPHRINE 40 MCG/ML (10ML) SYRINGE FOR IV PUSH (FOR BLOOD PRESSURE SUPPORT)
80.0000 ug | PREFILLED_SYRINGE | INTRAVENOUS | Status: DC | PRN
  Filled 2019-11-24: qty 10

## 2019-11-24 NOTE — Anesthesia Procedure Notes (Signed)
Epidural Patient location during procedure: OB Start time: 11/24/2019 8:22 PM End time: 11/24/2019 8:38 PM  Staffing Anesthesiologist: Shelton Silvas, MD Performed: anesthesiologist   Preanesthetic Checklist Completed: patient identified, IV checked, site marked, risks and benefits discussed, surgical consent, monitors and equipment checked, pre-op evaluation and timeout performed  Epidural Patient position: sitting Prep: ChloraPrep Patient monitoring: heart rate, continuous pulse ox and blood pressure Approach: midline Location: L3-L4 Injection technique: LOR saline  Needle:  Needle type: Tuohy  Needle gauge: 17 G Needle length: 9 cm Catheter type: closed end flexible Catheter size: 20 Guage Test dose: negative and 1.5% lidocaine  Assessment Events: blood not aspirated, injection not painful, no injection resistance and no paresthesia  Additional Notes Pt unable to get in good position due to contraction pain.   1st Attempt - LOR @ 5.5, unable to adv. Catheter through Tuohy 2nd Attempt (Same Level) - LOR @ 5.5. adv. Catheter after "pop" sensation, unable to admin bolus, aspirate 3 cc's, removed catheter, catheter noted to be kinked.  3rd Attempt (Up on level) - LOR @ 6, no issues.   Patient identified. Risks/Benefits/Options discussed with patient including but not limited to bleeding, infection, nerve damage, paralysis, failed block, incomplete pain control, headache, blood pressure changes, nausea, vomiting, reactions to medications, itching and postpartum back pain. Confirmed with bedside nurse the patient's most recent platelet count. Confirmed with patient that they are not currently taking any anticoagulation, have any bleeding history or any family history of bleeding disorders. Patient expressed understanding and wished to proceed. All questions were answered. Sterile technique was used throughout the entire procedure. Please see nursing notes for vital signs. Test dose  was given through epidural catheter and negative prior to continuing to dose epidural or start infusion. Warning signs of high block given to the patient including shortness of breath, tingling/numbness in hands, complete motor block, or any concerning symptoms with instructions to call for help. Patient was given instructions on fall risk and not to get out of bed. All questions and concerns addressed with instructions to call with any issues or inadequate analgesia.    Reason for block:procedure for pain

## 2019-11-24 NOTE — Progress Notes (Signed)
Patient ID: Shandricka Monroy, female   DOB: [birth year], 34 y.o.   MRN: [id number]  Margarita Croke is a 34 y.o. [id number] at [redacted]w[redacted]d admitted for IOL for pre-eclampsia.  Subjective: Feeling some intermittent contractions.  Agreeable to Foley bulb placement.   Objective: BP (!) 137/99    Pulse 97    Temp 98.1 F (36.7 C) (Oral)    Resp 18    Ht 5\' 1"  (1.549 m)    Wt 110.7 kg    LMP 03/05/2019 (Approximate)    SpO2 100% Comment: room air   BMI 46.10 kg/m  No intake/output data recorded.  FHT:  FHR: 130 bpm, variability: moderate,  accelerations:  Present,  decelerations:  Absent UC:   Irregular  SVE:   Dilation: 1 Effacement (%): Thick Station: -3 Exam by:: MD  Labs: Lab Results  Component Value Date   WBC 10.3 11/24/2019   HGB 11.0 (L) 11/24/2019   HCT 34.1 (L) 11/24/2019   MCV 89.7 11/24/2019   PLT 223 11/24/2019    Assessment / Plan: 34 y.o. 31 at [redacted]w[redacted]d admitted for IOL for gHTN.   Labor: S/p Cytotec x1.  Foley bulb placed this check.   Fetal Wellbeing:  Category I Pain Control:  Per patient request I/D:  GBS negative Anticipated MOD:  SVD  Pre-eclampsia: No severe range BP and asymptomatic.  Labs significant for P/C ratio 0.46. creatinine 1.16.  Continue to monitor. Chronic proteinuria: Nephro consult PP   Yuta Cipollone [redacted]w[redacted]d, MD PGY-2 Resident Family Medicine 11/24/2019, 4:52 PM

## 2019-11-24 NOTE — Anesthesia Preprocedure Evaluation (Signed)
Anesthesia Evaluation    Reviewed: Allergy & Precautions, Patient's Chart, lab work & pertinent test results  Airway Mallampati: III       Dental   Pulmonary neg pulmonary ROS,    Pulmonary exam normal        Cardiovascular hypertension, Normal cardiovascular exam     Neuro/Psych  Headaches, negative psych ROS   GI/Hepatic negative GI ROS, Neg liver ROS,   Endo/Other  negative endocrine ROS  Renal/GU negative Renal ROS     Musculoskeletal negative musculoskeletal ROS (+)   Abdominal (+) + obese,   Peds  Hematology negative hematology ROS (+)   Anesthesia Other Findings   Reproductive/Obstetrics (+) Pregnancy                             Anesthesia Physical Anesthesia Plan  ASA: III  Anesthesia Plan: Epidural   Post-op Pain Management:    Induction:   PONV Risk Score and Plan: 0  Airway Management Planned: Natural Airway  Additional Equipment: None  Intra-op Plan:   Post-operative Plan:   Informed Consent: I have reviewed the patients History and Physical, chart, labs and discussed the procedure including the risks, benefits and alternatives for the proposed anesthesia with the patient or authorized representative who has indicated his/her understanding and acceptance.     Dental advisory given  Plan Discussed with:   Anesthesia Plan Comments: (Lab Results      Component                Value               Date                      WBC                      13.1 (H)            11/24/2019                HGB                      11.8 (L)            11/24/2019                HCT                      36.2                11/24/2019                MCV                      90.3                11/24/2019                PLT                      246                 11/24/2019           )        Anesthesia Quick Evaluation

## 2019-11-24 NOTE — MAU Note (Signed)
Patient reports that she has felt more fetal movement since being in MAU.

## 2019-11-24 NOTE — H&P (Signed)
Ariana Downs is a 34 y.o. female [phone] hx vaginal delivery x 2 pt of [facility] with chronic proteinuria presenting for decreased fetal movement to MAU. BP elevated in MAU and PEC labs continue to indicate proteinuria and elevated creatinine.  Given HTN today and decreased fetal movement, IOL for GHTN today vs continuing to observe for unknown kidney condition. Pt to follow up with nephrology postpartum.   OB History    Gravida  4   Para  2   Term  2   Preterm      AB  1   Living  2     SAB  1   TAB      Ectopic      Multiple      Live Births             Past Medical History:  Diagnosis Date  . Headache   . Vertigo    Past Surgical History:  Procedure Laterality Date  . NO PAST SURGERIES     Family History: family history includes Diabetes in her mother. Social History:  reports that she has never smoked. She has never used smokeless tobacco. She reports that she does not drink alcohol and does not use drugs.     Maternal Diabetes: No Genetic Screening: Normal Maternal Ultrasounds/Referrals: Normal Fetal Ultrasounds or other Referrals:  None Maternal Substance Abuse:  No Significant Maternal Medications:  None Significant Maternal Lab Results:  Group B Strep negative Other Comments:  None  Review of Systems  Constitutional: Negative for chills, fatigue and fever.  Eyes: Negative for visual disturbance.  Respiratory: Negative for shortness of breath.   Cardiovascular: Negative for chest pain.  Gastrointestinal: Negative for abdominal pain and vomiting.  Genitourinary: Negative for difficulty urinating, dysuria, flank pain, pelvic pain, vaginal bleeding, vaginal discharge and vaginal pain.  Neurological: Negative for dizziness and headaches.  Psychiatric/Behavioral: Negative.    Maternal Medical History:  Reason for admission: HTN at term   Fetal activity: Perceived fetal activity is normal.    Prenatal complications: Proteinuria without  diagnosis  Prenatal Complications - Diabetes: none.    Dilation: Fingertip Effacement (%): Thick Exam by:: Sharen Counter CNM Blood pressure 118/75, pulse 81, temperature 98.1 F (36.7 C), temperature source Oral, resp. rate 18, last menstrual period 03/05/2019, SpO2 100 %. Maternal Exam:  Uterine Assessment: Contraction strength is mild.  Contraction frequency is rare.   Abdomen: Fetal presentation: vertex  Pelvis: adequate for delivery.   Cervix: Cervix evaluated by digital exam.     Fetal Exam Fetal Monitor Review: Mode: ultrasound.   Baseline rate: 135.  Variability: moderate (6-25 bpm).   Pattern: accelerations present and no decelerations.    Fetal State Assessment: Category I - tracings are normal.     Physical Exam  Nursing note and vitals reviewed. Constitutional: She is oriented to person, place, and time. She appears well-developed.  Cardiovascular: Normal rate, regular rhythm and normal heart sounds.  Respiratory: Effort normal.  GI: Soft.  Musculoskeletal:        General: Normal range of motion.     Cervical back: Normal range of motion.  Neurological: She is alert and oriented to person, place, and time.  Skin: Skin is warm and dry.  Psychiatric: Her behavior is normal. Judgment and thought content normal.    Prenatal labs: ABO, Rh:  O positive Antibody:  Negative Rubella:  immune RPR:   Nonreactive HBsAg:   nonreactive HIV:   negative GBS:   negative 11/14/19  Assessment/Plan: 34 y.o. [id number] at [redacted]w[redacted]d GHTN Chronic proteinuria Marginal cord insertion with normal growth on MFM [place] Decreased fetal movement GBS neg  Consult with Dr Rip Harbour given chronic proteinuria Admit for IOL for GHTN at term Cytotec/Foley bulb for cervical ripening  Fatima Blank 11/24/2019, 12:41 PM

## 2019-11-24 NOTE — MAU Note (Signed)
Ariana Downs is a 34 y.o. at [redacted]w[redacted]d here in MAU reporting:  +DFM Last felt movement yesterday Denies vaginal bleeding or LOF. Pain score: denies  Vitals:   11/24/19 0909  BP: (!) 145/88  Pulse: 93  Resp: 18  Temp: 98.1 F (36.7 C)  SpO2: 100%    Patient reports she is not on any medication for her blood pressure; but was told "I could get pre-clampsia". Denies headache, vision changes, epigastric pain, nor increase in swelling at this time.   FHT:131; RN palpated fetal movement upon EFM application; patient confirmed that she felt movement as well  Lab orders placed from triage: ua

## 2019-11-24 NOTE — Progress Notes (Signed)
Ariana Downs is a 34 y.o. [id number] at [redacted]w[redacted]d admitted for IOL 2/2 gHTN vs Pre-E w/o SF  Subjective: Comfortable with epidural, feeling more pressure  Objective: BP (!) 94/59   Pulse 93   Temp 99.7 F (37.6 C) (Oral)   Resp 18   Ht 5\' 1"  (1.549 m)   Wt 110.7 kg   LMP 03/05/2019 (Approximate)   SpO2 98%   BMI 46.10 kg/m  No intake/output data recorded.  FHT:  FHR: 135 bpm, variability: minimal-moderate,  accelerations:  Abscent,  decelerations:  Absent UC:   regular, every 2-3 minutes  SVE:   Dilation: 4 Effacement (%): 50 Station: -3 Exam by:: [email or web address] RN  Pitocin @ 4 mu/min  Labs: Lab Results  Component Value Date   WBC 13.1 (H) 11/24/2019   HGB 11.8 (Ariana) 11/24/2019   HCT 36.2 11/24/2019   MCV 90.3 11/24/2019   PLT 246 11/24/2019    Assessment / Plan: 34 y.o. 31 at [redacted]w[redacted]d admitted for IOL for gHTN v Pre-E w/o SF in the context of chronic proteinuria  Labor: S/p Cytotec x1 and FB. Pit started at 1945, now at 4. Consider AROM if appropriate. Fetal Wellbeing:  Category II, reassuring for moderate variability Pain Control:  Just got epidural I/D:  GBS negative Anticipated MOD:  Vaginal  GHTN v Pre-E w/o sf: No severe range BP and asymptomatic.  Labs significant for P/C ratio 0.46. creatinine 1.16.  Does have chronic proteinuria Chronic proteinuria: Nephro consult PP  Ariana Downs Ariana Alijah Akram DO OB Fellow, Faculty Practice 11/24/2019, 9:23 PM

## 2019-11-25 ENCOUNTER — Encounter (HOSPITAL_COMMUNITY): Payer: Self-pay | Admitting: Obstetrics and Gynecology

## 2019-11-25 ENCOUNTER — Inpatient Hospital Stay (HOSPITAL_COMMUNITY): Payer: Medicaid Other

## 2019-11-25 DIAGNOSIS — O1214 Gestational proteinuria, complicating childbirth: Secondary | ICD-10-CM

## 2019-11-25 DIAGNOSIS — Z3A37 37 weeks gestation of pregnancy: Secondary | ICD-10-CM

## 2019-11-25 LAB — TYPE AND SCREEN
ABO/RH(D): O POS
Antibody Screen: NEGATIVE

## 2019-11-25 LAB — RPR: RPR Ser Ql: NONREACTIVE

## 2019-11-25 MED ORDER — DIBUCAINE (PERIANAL) 1 % EX OINT: 1.0000 "application " | TOPICAL_OINTMENT | CUTANEOUS | Status: DC | PRN

## 2019-11-25 MED ORDER — BENZOCAINE-MENTHOL 20-0.5 % EX AERO
1.0000 "application " | INHALATION_SPRAY | CUTANEOUS | Status: DC | PRN
  Administered 2019-11-25: 1 via TOPICAL
  Filled 2019-11-25: qty 56

## 2019-11-25 MED ORDER — DIPHENHYDRAMINE HCL 25 MG PO CAPS: 25.0000 mg | ORAL_CAPSULE | Freq: Four times a day (QID) | ORAL | Status: DC | PRN

## 2019-11-25 MED ORDER — WITCH HAZEL-GLYCERIN EX PADS: 1.0000 "application " | MEDICATED_PAD | CUTANEOUS | Status: DC | PRN

## 2019-11-25 MED ORDER — ONDANSETRON HCL 4 MG PO TABS: 4.0000 mg | ORAL_TABLET | ORAL | Status: DC | PRN

## 2019-11-25 MED ORDER — MEASLES, MUMPS & RUBELLA VAC IJ SOLR: 0.5000 mL | Freq: Once | INTRAMUSCULAR | Status: DC

## 2019-11-25 MED ORDER — ACETAMINOPHEN 325 MG PO TABS
650.0000 mg | ORAL_TABLET | Freq: Four times a day (QID) | ORAL | Status: DC | PRN
  Administered 2019-11-25 (×2): 650 mg via ORAL
  Filled 2019-11-25 (×2): qty 2

## 2019-11-25 MED ORDER — TETANUS-DIPHTH-ACELL PERTUSSIS 5-2.5-18.5 LF-MCG/0.5 IM SUSP: 0.5000 mL | Freq: Once | INTRAMUSCULAR | Status: DC

## 2019-11-25 MED ORDER — ONDANSETRON HCL 4 MG/2ML IJ SOLN: 4.0000 mg | INTRAMUSCULAR | Status: DC | PRN

## 2019-11-25 MED ORDER — COCONUT OIL OIL: 1.0000 "application " | TOPICAL_OIL | Status: DC | PRN

## 2019-11-25 MED ORDER — IBUPROFEN 600 MG PO TABS
600.0000 mg | ORAL_TABLET | Freq: Three times a day (TID) | ORAL | Status: DC | PRN
  Administered 2019-11-25 – 2019-11-26 (×2): 600 mg via ORAL
  Filled 2019-11-25 (×4): qty 1

## 2019-11-25 MED ORDER — PRENATAL MULTIVITAMIN CH
1.0000 | ORAL_TABLET | Freq: Every day | ORAL | Status: DC
  Administered 2019-11-25: 1 via ORAL
  Filled 2019-11-25 (×2): qty 1

## 2019-11-25 MED ORDER — SENNOSIDES-DOCUSATE SODIUM 8.6-50 MG PO TABS
2.0000 | ORAL_TABLET | ORAL | Status: DC
  Administered 2019-11-26: 2 via ORAL
  Filled 2019-11-25: qty 2

## 2019-11-25 MED ORDER — SIMETHICONE 80 MG PO CHEW: 80.0000 mg | CHEWABLE_TABLET | ORAL | Status: DC | PRN

## 2019-11-25 NOTE — Discharge Summary (Addendum)
   Postpartum Discharge Summary     Patient Name: Ariana Downs DOB: 04/25/1986 MRN: [id number]  Date of admission: 11/24/2019 Delivery date:11/25/2019  Delivering provider: SPARACINO, HAILEY L  Date of discharge: 11/26/2019  Admitting diagnosis: Gestational hypertension [O13.9] Intrauterine pregnancy: [redacted]w[redacted]d     Secondary diagnosis:  Active Problems:   Proteinuria affecting pregnancy in third trimester   Gestational hypertension  Additional problems: Chronic proteinuria, Baseline creatinine 1.1-1.2  Discharge diagnosis: Term Pregnancy Delivered                                              Post partum procedures:  Renal ultrasound Augmentation: Pitocin and IP Foley Complications: None  Hospital course: Induction of Labor With Vaginal Delivery   34 y.o. yo G4P2012 at [redacted]w[redacted]d was admitted to the hospital 11/24/2019 for induction of labor.  Indication for induction: Gestational hypertension and Pre-E w/o SF.  Patient had an uncomplicated labor course as follows:  Induction was started with a FB. Pitocin was started after it came out and then she SROMed. She progressed to complete and delivered shortly after.  Membrane Rupture Time/Date: 5:20 AM ,11/25/2019   Delivery Method:Vaginal, Spontaneous  Episiotomy: None  Lacerations:  1st degree  Details of delivery can be found in separate delivery note.  After delivery, nephrology was consulted for the patient's chronic proteinuria. Renal ultrasound was normal, and nephrology recommended outpatient follow up. Patient is discharged home 11/26/19.  Newborn Data: Birth date:11/25/2019  Birth time:7:47 AM  Gender:Female  Living status:Living  Apgars:8 ,9  Weight:3175 g   Magnesium Sulfate received: No BMZ received: No Rhophylac:N/A MMR:N/A T-DaP:Given prenatally Flu: N/A Transfusion:No  Physical exam  Vitals:   11/25/19 1044 11/25/19 1440 11/25/19 1827 11/25/19 2050  BP: 126/72  107/62 (!) 90/55  Pulse: 84 88 84 80  Resp: 16  16 20 18  Temp: 98 F (36.7 C) 98 F (36.7 C) 98.5 F (36.9 C) 98.1 F (36.7 C)  TempSrc: Oral Oral Oral Oral  SpO2: 100% 100% 100% 99%  Weight:      Height:       General: alert, cooperative and no distress Lochia: appropriate Uterine Fundus: firm Incision: N/A DVT Evaluation: No evidence of DVT seen on physical exam. Labs: Lab Results  Component Value Date   WBC 13.1 (H) 11/24/2019   HGB 11.8 (L) 11/24/2019   HCT 36.2 11/24/2019   MCV 90.3 11/24/2019   PLT 246 11/24/2019   CMP Latest Ref Rng & Units 11/24/2019  Glucose 70 - 99 mg/dL 96  BUN 6 - 20 mg/dL 12  Creatinine 0.44 - 1.00 mg/dL 1.16(H)  Sodium 135 - 145 mmol/L 138  Potassium 3.5 - 5.1 mmol/L 4.2  Chloride 98 - 111 mmol/L 111  CO2 22 - 32 mmol/L 20(L)  Calcium 8.9 - 10.3 mg/dL 9.5  Total Protein 6.5 - 8.1 g/dL 5.9(L)  Total Bilirubin 0.3 - 1.2 mg/dL 0.2(L)  Alkaline Phos 38 - 126 U/L 99  AST 15 - 41 U/L 15  ALT 0 - 44 U/L 13   Edinburgh Score: No flowsheet data found.   After visit meds:  Allergies as of 11/26/2019   No Known Allergies      Medication List     STOP taking these medications    cyclobenzaprine 10 MG tablet Commonly known as: FLEXERIL       TAKE these medications      acetaminophen 500 MG tablet Commonly known as: TYLENOL Take 1,000 mg by mouth every 6 (six) hours as needed for moderate pain or headache.   ibuprofen 600 MG tablet Commonly known as: ADVIL Take 1 tablet (600 mg total) by mouth every 8 (eight) hours as needed for mild pain.   norethindrone 0.35 MG tablet Commonly known as: Ortho Micronor Take 1 tablet (0.35 mg total) by mouth daily.   prenatal vitamin w/FE, FA 27-1 MG Tabs tablet Take 1 tablet by mouth daily at 12 noon.         Discharge home in stable condition Infant Feeding: Bottle and Breast Infant Disposition:home with mother Discharge instruction: per After Visit Summary and Postpartum booklet. Activity: Advance as tolerated. Pelvic rest for  6 weeks.  Diet: routine diet Future Appointments:No future appointments. Follow up Visit:  Follow-up Information     Edrick Oh, MD Follow up.   Specialty: Nephrology Why: [facility] [phone].  Please call for appointment Contact information: [facility] [place] [address] [phone]                  Please schedule this patient for a In person postpartum visit in 4 weeks with the following provider: Any provider at [facility] Additional Postpartum F/U:  None   High risk pregnancy complicated by:  chronic proteinuria Delivery mode:  Vaginal, Spontaneous  Anticipated Birth Control:  POPs   EMILY Madelin Headings, MD PGY-2 Resident Family Medicine 11/26/2019, 8:34 AM  GME ATTESTATION:  I saw and evaluated the patient. I agree with the findings and the plan of care as documented in the resident's note.  Merilyn Baba, DO OB Fellow, [facility] 11/26/2019 7:58 PM

## 2019-11-25 NOTE — Consult Note (Signed)
[facility] Renal Consultation Note  Requesting MD:  Indication for Consultation: Hypertension, chronic kidney disease stage III, maintenance of euvolemia, treatment of electrolyte and acid-base disorders  HPI:   Ariana Downs is a 34 y.o. female.  G4 P2-0-1-2 at 37 weeks 6 days admitted to the hospital 11/24/2019 for induction of labor.  Started on a Pitocin drip and spontaneously delivered with a first-degree laceration.   Consultation due to chronic kidney disease creatinine appears to be about 1.1-1.2 at baseline.  And urine pregnancy the creatinine did not fall to 0.6 to 0.8 mg/dL as would be expected.  She does have some hypertension although in the postoperative period it appears her blood pressures fairly well controlled.  Medicines prior to admission prenatal vitamins Flexeril 10 mg as needed  Blood pressure 100 2672 pulse 84 temperature 98   Sodium 138 potassium 4.2 chloride 111 CO2 20 BUN 12 creatinine 1.16 glucose 96 calcium 9.5 albumin 2.4 liver enzymes are normal range hemoglobin 11.8 WBC 13.1  Urinalysis 11/24/2019 was negative for protein microscopic study did not show any cellular elements 10/17/2019    Creatinine, Ser  Date/Time Value Ref Range Status  11/24/2019 10:23 AM 1.16 (H) 0.44 - 1.00 mg/dL Final  11/30/9483 46:27 PM 1.23 (H) 0.44 - 1.00 mg/dL Final  03/50/0938 18:29 PM 1.10 (H) 0.44 - 1.00 mg/dL Final  93/71/6967 89:38 PM 1.00 0.44 - 1.00 mg/dL Final  03/22/5101 58:52 PM 1.13 (H) 0.50 - 1.10 mg/dL Final     PMHx:   Past Medical History:  Diagnosis Date  . Headache   . Vertigo     Past Surgical History:  Procedure Laterality Date  . NO PAST SURGERIES      Family Hx:  Family History  Problem Relation Age of Onset  . Diabetes Mother     Social History:  reports that she has never smoked. She has never used smokeless tobacco. She reports that she does not drink alcohol and does not use drugs.  Allergies: No Known  Allergies  Medications: Prior to Admission medications   Medication Sig Start Date End Date Taking? Authorizing Provider  acetaminophen (TYLENOL) 500 MG tablet Take 1,000 mg by mouth every 6 (six) hours as needed for moderate pain or headache.    [provider]  cyclobenzaprine (FLEXERIL) 10 MG tablet Take 1 tablet (10 mg total) by mouth 3 (three) times daily as needed (muscle pain). Patient not taking: Reported on 07/22/2019 07/11/19   Donette Larry, CNM  prenatal vitamin w/FE, FA (PRENATAL 1 + 1) 27-1 MG TABS tablet Take 1 tablet by mouth daily at 12 noon.    [provider]     Labs:  Results for orders placed or performed during the hospital encounter of 11/24/19 (from the past 48 hour(s))  Urinalysis, Routine w reflex microscopic     Status: Abnormal   Collection Time: 11/24/19  9:25 AM  Result Value Ref Range   Color, Urine STRAW (A) YELLOW   APPearance CLEAR CLEAR   Specific Gravity, Urine 1.004 (L) 1.005 - 1.030   pH 6.0 5.0 - 8.0   Glucose, UA NEGATIVE NEGATIVE mg/dL   Hgb urine dipstick NEGATIVE NEGATIVE   Bilirubin Urine NEGATIVE NEGATIVE   Ketones, ur NEGATIVE NEGATIVE mg/dL   Protein, ur NEGATIVE NEGATIVE mg/dL   Nitrite NEGATIVE NEGATIVE   Leukocytes,Ua NEGATIVE NEGATIVE    Comment: Performed at [facility], [address], [place], [place] [address]  Protein / creatinine ratio, urine  Status: Abnormal   Collection Time: 11/24/19  9:46 AM  Result Value Ref Range   Creatinine, Urine 32.44 mg/dL   Total Protein, Urine 15 mg/dL    Comment: NO NORMAL RANGE ESTABLISHED FOR THIS TEST   Protein Creatinine Ratio 0.46 (H) 0.00 - 0.15 mg/mg[Cre]    Comment: Performed at [facility], [address], [place], [place] [address]  CBC     Status: Abnormal   Collection Time: 11/24/19 10:23 AM  Result Value Ref Range   WBC 10.3 4.0 - 10.5 K/uL   RBC 3.80 (L) 3.87 - 5.11 MIL/uL   Hemoglobin 11.0 (L) 12.0 - 15.0 g/dL   HCT 91.6 (L) 36 - 46 %    MCV 89.7 80.0 - 100.0 fL   MCH 28.9 26.0 - 34.0 pg   MCHC 32.3 30.0 - 36.0 g/dL   RDW 38.4 66.5 - 99.3 %   Platelets 223 150 - 400 K/uL   nRBC 0.0 0.0 - 0.2 %    Comment: Performed at [facility], [address] [place], [place] [address]  Comprehensive metabolic panel     Status: Abnormal   Collection Time: 11/24/19 10:23 AM  Result Value Ref Range   Sodium 138 135 - 145 mmol/L   Potassium 4.2 3.5 - 5.1 mmol/L   Chloride 111 98 - 111 mmol/L   CO2 20 (L) 22 - 32 mmol/L   Glucose, Bld 96 70 - 99 mg/dL    Comment: Glucose reference range applies only to samples taken after fasting for at least 8 hours.   BUN 12 6 - 20 mg/dL   Creatinine, Ser 7.93 (H) 0.44 - 1.00 mg/dL   Calcium 9.5 8.9 - 90.3 mg/dL   Total Protein 5.9 (L) 6.5 - 8.1 g/dL   Albumin 2.4 (L) 3.5 - 5.0 g/dL   AST 15 15 - 41 U/L   ALT 13 0 - 44 U/L   Alkaline Phosphatase 99 38 - 126 U/L   Total Bilirubin 0.2 (L) 0.3 - 1.2 mg/dL   GFR calc non Af Amer >60 >60 mL/min   GFR calc Af Amer >60 >60 mL/min   Anion gap 7 5 - 15    Comment: Performed at [facility], [address] [place], [place] [address]  Type and screen [facility]     Status: None   Collection Time: 11/24/19 10:23 AM  Result Value Ref Range   ABO/RH(D) O POS    Antibody Screen NEG    Sample Expiration 11/24/2019,2359   RPR     Status: None   Collection Time: 11/24/19 10:23 AM  Result Value Ref Range   RPR Ser Ql NON REACTIVE NON REACTIVE    Comment: Performed at [facility], [address], [place], [place] [address]  ABO/Rh     Status: None   Collection Time: 11/24/19 10:23 AM  Result Value Ref Range   ABO/RH(D)      O POS Performed at [facility], [address] [place], [place] [address]   No blood products     Status: None   Collection Time: 11/24/19 10:23 AM  Result Value Ref Range   Transfuse no blood products      TRANSFUSE NO BLOOD PRODUCTS, VERIFIED BY ABBY, RN Performed at [facility], [address], [place], [place] [address]   SARS Coronavirus 2 by RT PCR (hospital order, performed in [facility] hospital lab)  Nasopharyngeal Nasopharyngeal Swab     Status: None   Collection Time: 11/24/19 12:42 PM   Specimen: Nasopharyngeal Swab  Result Value Ref Range   SARS Coronavirus 2 NEGATIVE NEGATIVE    Comment: (NOTE) SARS-CoV-2 target nucleic acids are NOT DETECTED.  The SARS-CoV-2 RNA is generally detectable in upper and lower respiratory specimens during the acute phase of infection. The lowest concentration of SARS-CoV-2 viral copies this assay can detect is 250 copies / mL. A negative result does not preclude SARS-CoV-2 infection and should not be used as the sole basis for treatment or other patient management decisions.  A negative result may occur with improper specimen collection / handling, submission of specimen other than nasopharyngeal swab, presence of viral mutation(s) within the areas targeted by this assay, and inadequate number of viral copies (<250 copies / mL). A negative result must be combined with clinical observations, patient history, and epidemiological information.  Fact Sheet for Patients:   StrictlyIdeas.no  Fact Sheet for Healthcare Providers: BankingDealers.co.za  This test is not yet approved or  cleared by the [place] FDA and has been authorized for detection and/or diagnosis of SARS-CoV-2 by FDA under an Emergency Use Authorization (EUA).  This EUA will remain in effect (meaning this test can be used) for the duration of the COVID-19 declaration under Section 564(b)(1) of the Act, 21 U.S.C. section 360bbb-3(b)(1), unless the authorization is terminated or revoked sooner.  Performed at [facility], [place] [address] [place], [place] [address]   CBC     Status: Abnormal   Collection Time: 11/24/19  7:59 PM  Result Value Ref Range   WBC 13.1 (H) 4.0 - 10.5 K/uL    RBC 4.01 3.87 - 5.11 MIL/uL   Hemoglobin 11.8 (L) 12.0 - 15.0 g/dL   HCT 36.2 36 - 46 %   MCV 90.3 80.0 - 100.0 fL   MCH 29.4 26.0 - 34.0 pg   MCHC 32.6 30.0 - 36.0 g/dL   RDW 14.9 11.5 - 15.5 %   Platelets 246 150 - 400 K/uL   nRBC 0.0 0.0 - 0.2 %    Comment: Performed at [facility], [place] [address], [place], [place] [address]     ROS:  General denies fatigue fever sweats or chills Eyes no visual change for vision double vision Ears nose mouth throat no hearing loss epistaxis no sore throat Cardiovascular anginal chest pain orthopnea PND's palpitation or leg swelling Respiratory no cough wheeze and obsess Abdominal system abdominal pain nausea vomiting change bowels blood in stools Urogenital status post partum x1 day Neurologic no stroke seizures numbness tingling weakness no dysarthria dysphagia dysphonia Endocrine no diabetes thyroid or adrenal disease Dermatologic no skin rash no itching  Physical Exam: Vitals:   11/25/19 0925 11/25/19 1044  BP: 135/77 126/72  Pulse: 91 84  Resp: 16 16  Temp: 98.4 F (36.9 C) 98 F (36.7 C)  SpO2: 100% 100%     General: Alert pleasant lady known obvious distress postpartum day 1 Eyes no icterus no pallor pupils equal and reactive Ears nose throat external appearance normal oropharynx was clear Neck was supple no thyromegaly or adenopathy JVP not elevated Cardiovascular regular rate and rhythm no murmurs rubs or gallops Respiratory clear to auscultation no wheeze rales Abdomen soft nontender Extremities no cyanosis clubbing or edema Dermatologic no skin rash Neurologic grossly intact   Assessment/Plan: 1.Renal-patient's creatinine appears to be at 1.1 to 1.3 mg/dL at baseline this could be secondary to  hypertensive nephrosclerosis urinalysis did not reveal any evidence of proteinuria which would rule out any significant glomerular disease.  I think this simply represents nephrosclerosis.  Avoid nephrotoxins ACE inhibitor's  ARB is, NSAIDs and Cox 2 inhibitors.  Avoid the use of IV contrast.  We will check a renal ultrasound to rule out any sort of congenital abnormality such as polycystic kidney disease 2. Hypertension/volume  -appears to be controlled this morning.  Without the use of any antihypertensive medications appears to be stable.  I think we can follow [place] as an outpatient 3. Anemia  -appears to be well controlled at this point.  Is not an issue.  If all is well with renal ultrasound I think the patient be followed up at [facility] [phone].  Please call for appointment  Garnetta Buddy 11/25/2019, 1:06 PM

## 2019-11-25 NOTE — Anesthesia Postprocedure Evaluation (Signed)
Anesthesia Post Note  Patient: Ariana Downs  Procedure(s) Performed: AN AD HOC LABOR EPIDURAL     Patient location during evaluation: Mother Baby Anesthesia Type: Epidural Level of consciousness: awake and alert Pain management: pain level controlled Vital Signs Assessment: post-procedure vital signs reviewed and stable Respiratory status: spontaneous breathing, nonlabored ventilation and respiratory function stable Cardiovascular status: stable Postop Assessment: no headache, no backache and epidural receding Anesthetic complications: no   No complications documented.  Last Vitals:  Vitals:   11/25/19 1044 11/25/19 1440  BP: 126/72   Pulse: 84 88  Resp: 16 16  Temp: 36.7 C 36.7 C  SpO2: 100% 100%    Last Pain:  Vitals:   11/25/19 1440  TempSrc: Oral  PainSc: 0-No pain   Pain Goal:                   Arthuro Canelo

## 2019-11-25 NOTE — Progress Notes (Signed)
Ariana Downs is a 34 y.o. [id number] at [redacted]w[redacted]d admitted for IOL 2/2 gHTN v Pre-E w/o SF  Subjective: Just SROMed with clear fluid  Objective: BP 114/79   Pulse (!) 113   Temp 98.3 F (36.8 C) (Oral)   Resp 18   Ht 5\' 1"  (1.549 m)   Wt 110.7 kg   LMP 03/05/2019 (Approximate)   SpO2 100%   BMI 46.10 kg/m  No intake/output data recorded.  FHT:  FHR: 125 bpm, variability: moderate,  accelerations:  Present,  decelerations:  Present occasional variable UC:   regular, every 2-3 minutes  SVE:   Dilation: 5 Effacement (%): 50 Station: -3 Exam by:: Dr. [email or web address]  Pitocin @ 18 mu/min  Labs: Lab Results  Component Value Date   WBC 13.1 (H) 11/24/2019   HGB 11.8 (L) 11/24/2019   HCT 36.2 11/24/2019   MCV 90.3 11/24/2019   PLT 246 11/24/2019    Assessment / Plan: 34 y.31 at [redacted]w[redacted]d admitted for IOL for gHTNv Pre-E w/o SF in the context of chronic proteinuria  Labor:S/p Cytotec x1and FB. Pit at 18. Patient just SROMed with clear fluid. Fetal Wellbeing:Category I Pain Control:Comfortable with epidural I/D:GBS negative Anticipated [redacted]w[redacted]d GHTN v Pre-E w/o sf: No severe range BP and asymptomatic. Labs significant for P/C ratio 0.46. Creatinine 1.16.Does have chronic proteinuria Chronic proteinuria:Nephro consult PP  Ariana Downs L Brindy Higginbotham DO OB Fellow, [facility] 11/25/2019, 5:23 AM

## 2019-11-25 NOTE — Lactation Note (Signed)
This note was copied from a baby's chart. Lactation Consultation Note  Patient Name: Ariana Downs Date: 11/25/2019  per RN, Sammuel Hines, mom reports she does not want to see lactation   Maternal Data    Feeding    LATCH Score                   Interventions    Lactation Tools Discussed/Used     Consult Status      Neomia Dear 11/25/2019, 3:40 PM

## 2019-11-25 NOTE — Discharge Instructions (Signed)
Parto vaginal, cuidados de puerperio Postpartum Care After Vaginal Delivery Lea esta informacin sobre cmo cuidarse desde el momento en que nazca su beb y hasta 6 a 12 semanas despus del parto (perodo del posparto). El mdico tambin podr darle instrucciones ms especficas. Comunquese con su mdico si tiene problemas o preguntas. Siga estas indicaciones en su casa: Hemorragia vaginal  Es normal tener un poco de hemorragia vaginal (loquios) despus del parto. Use un apsito sanitario para el sangrado vaginal y secrecin. ? Durante la primera semana despus del parto, la cantidad y el aspecto de los loquios a menudo es similar a las del perodo menstrual. ? Durante las siguientes semanas disminuir gradualmente hasta convertirse en una secrecin seca amarronada o amarillenta. ? En la mayora de las mujeres, los loquios se detienen completamente entre 4 a 6semanas despus del parto. Los sangrados vaginales pueden variar de mujer a mujer.  Cambie los apsitos sanitarios con frecuencia. Observe si hay cambios en el flujo, como: ? Un aumento repentino en el volumen. ? Cambio en el color. ? Cogulos sanguneos grandes.  Si expulsa un cogulo de sangre por la vagina, gurdelo y llame al mdico para informrselo. No deseche los cogulos de sangre por el inodoro antes de hablar con su mdico.  No use tampones ni se haga duchas vaginales hasta que el mdico la autorice.  Si no est amamantando, volver a tener su perodo entre 6 y 8 semanas despus del parto. Si solamente alimenta al beb con leche materna (lactancia materna exclusiva), podra no volver a tener su perodo hasta que deje de amamantar. Cuidados perineales  Mantenga la zona entre la vagina y el ano (perineo) limpia y seca, como se lo haya indicado el mdico. Utilice apsitos o aerosoles analgsicos y cremas, como se lo hayan indicado.  Si le hicieron un corte en el perineo (episiotoma) o tuvo un desgarro en la vagina, controle la  zona para detectar signos de infeccin hasta que sane. Est atenta a los siguientes signos: ? Aumento del enrojecimiento, la hinchazn o el dolor. ? Presenta lquido o sangre que supura del corte o desgarro. ? Calor. ? Pus o mal olor.  Es posible que le den una botella rociadora para que use en lugar de limpiarse el rea con papel higinico despus de usar el bao. Cuando comience a sanar, podr usar la botella rociadora antes de secarse. Asegrese de secarse suavemente.  Para aliviar el dolor causado por una episiotoma, un desgarro en la vagina o venas hinchadas en el ano (hemorroides), trate de tomar un bao de asiento tibio 2 o 3 veces por da. Un bao de asiento es un bao de agua tibia que se toma mientras se est sentado. El agua solo debe llegar hasta las caderas y cubrir las nalgas. Cuidado de las mamas  En los primeros das despus del parto, las mamas pueden sentirse pesadas, llenas e incmodas (congestin mamaria). Tambin puede escaparse leche de sus senos. El mdico puede sugerirle mtodos para aliviar este malestar. La congestin mamaria debera desaparecer al cabo de unos das.  Si est amamantando: ? Use un sostn que sujete y ajuste bien sus pechos. ? Mantenga los pezones secos y limpios. Aplquese cremas y ungentos, como se lo haya indicado el mdico. ? Es posible que deba usar discos de algodn en el sostn para absorber la leche que se filtre de sus senos. ? Puede tener contracciones uterinas cada vez que amamante durante varias semanas despus del parto. Las contracciones uterinas ayudan al tero a   regresar a su tamao habitual. ? Si tiene algn problema con la lactancia materna, colabore con el mdico o un asesor en lactancia.  Si no est amamantando: ? Evite tocarse mucho las mamas. Al hacerlo, podran producir ms leche. ? Use un sostn que le proporcione el ajuste correcto y compresas fras para reducir la hinchazn. ? No extraiga (saque) leche materna. Esto har que  produzca ms leche. Intimidad y sexualidad  Pregntele al mdico cundo puede retomar la actividad sexual. Esto puede depender de lo siguiente: ? Su riesgo de sufrir infecciones. ? La rapidez con la que est sanando. ? Su comodidad y deseo de retomar la actividad sexual.  Despus del parto, puede quedar embarazada incluso si no ha tenido todava su perodo. Si lo desea, hable con el mdico acerca de los mtodos de control de la natalidad (mtodos anticonceptivos). Medicamentos  Tome los medicamentos de venta libre y los recetados solamente como se lo haya indicado el mdico.  Si le recetaron un antibitico, tmelo como se lo haya indicado el mdico. No deje de tomar el antibitico aunque comience a sentirse mejor. Actividad  Retome sus actividades normales de a poco como se lo haya indicado el mdico. Pregntele al mdico qu actividades son seguras para usted.  Descanse todo lo que pueda. Trate de descansar o tomar una siesta mientras el beb duerme. Comida y bebida   Beba suficiente lquido como para mantener la orina de color amarillo plido.  Coma alimentos ricos en fibras todos los das. Estos pueden ayudarla a prevenir o aliviar el estreimiento. Los alimentos ricos en fibras incluyen, entre otros: ? Panes y cereales integrales. ? Arroz integral. ? Frijoles. ? Frutas y verduras frescas.  No intente perder de peso rpidamente reduciendo el consumo de caloras.  Tome sus vitaminas prenatales hasta la visita de seguimiento de posparto o hasta que su mdico le indique que puede dejar de tomarlas. Estilo de vida  No consuma ningn producto que contenga nicotina o tabaco, como cigarrillos y cigarrillos electrnicos. Si necesita ayuda para dejar de fumar, consulte al mdico.  No beba alcohol, especialmente si est amamantando. Instrucciones generales  Concurra a todas las visitas de seguimiento para usted y el beb, como se lo haya indicado el mdico. La mayora de las mujeres  visita al mdico para un seguimiento de posparto dentro de las primeras 3 a 6 semanas despus del parto. Comunquese con un mdico si:  Se siente incapaz de controlar los cambios que implica tener un hijo y esos sentimientos no desaparecen.  Siente tristeza o preocupacin de forma inusual.  Las mamas se ponen rojas, le duelen o se endurecen.  Tiene fiebre.  Tiene dificultad para retener la orina o para impedir que la orina se escape.  Tiene poco inters o falta de inters en actividades que solan gustarle.  No ha amamantado nada y no ha tenido un perodo menstrual durante 12 semanas despus del parto.  Dej de amamantar al beb y no ha tenido su perodo menstrual durante 12 semanas despus de dejar de amamantar.  Tiene preguntas sobre su cuidado y el del beb.  Elimina un cogulo de sangre grande por la vagina. Solicite ayuda de inmediato si:  Siente dolor en el pecho.  Tiene dificultad para respirar.  Tiene un dolor repentino e intenso en la pierna.  Tiene dolor intenso o clicos en el la parte inferior del abdomen.  Tiene una hemorragia tan intensa de la vagina que empapa ms de un apsito en una hora. El sangrado   no debe ser ms abundante que el perodo ms intenso que haya tenido.  Dolor de cabeza intenso.  Se desmaya.  Tiene visin borrosa o manchas en la vista.  Tiene secrecin vaginal con mal olor.  Tiene pensamientos acerca de lastimarse a usted misma o a su beb. Si alguna vez siente que puede lastimarse a usted misma o a otras personas, o tiene pensamientos de poner fin a su vida, busque ayuda de inmediato. Puede dirigirse al departamento de emergencias ms cercano o llamar a:  El servicio de emergencias de su localidad (911 en [place]  Una lnea de asistencia al suicida y atencin en crisis, como la Lnea Nacional de Prevencin del Suicidio (National Suicide Prevention Lifeline), al 1-800-273-8255. Est disponible las 24 horas del da. Resumen  El  perodo de tiempo justo despus el parto y hasta 6 a 12 semanas despus del parto se denomina perodo posparto.  Retome sus actividades normales de a poco como se lo haya indicado el mdico.  Concurra a todas las visitas de seguimiento para usted y el beb, como se lo haya indicado el mdico. Esta informacin no tiene como fin reemplazar el consejo del mdico. Asegrese de hacerle al mdico cualquier pregunta que tenga. Document Revised: 09/02/2017 Document Reviewed: 05/14/2017 Elsevier Patient Education  2020 [employer]vier Inc.  

## 2019-11-25 NOTE — Progress Notes (Signed)
Ariana Downs is a 34 y.o. [id number] at [redacted]w[redacted]d admitted for IOL 2/2 gHTN v Pre-E w/o SF  Subjective: Lost IV access for about 45 minutes. Still comfortable with epidural  Objective: BP 110/68   Pulse 96   Temp 99.9 F (37.7 C) (Oral)   Resp 18   Ht 5\' 1"  (1.549 m)   Wt 110.7 kg   LMP 03/05/2019 (Approximate)   SpO2 100%   BMI 46.10 kg/m  No intake/output data recorded.  FHT:  FHR: 130 bpm, variability: moderate,  accelerations:  Present,  decelerations:  Absent UC:   regular, every 2-3 minutes  SVE:   Dilation: 4 Effacement (%): 50 Station: -3 Exam by:: [email or web address] RN  Pitocin @ 6 mu/min  Labs: Lab Results  Component Value Date   WBC 13.1 (H) 11/24/2019   HGB 11.8 (L) 11/24/2019   HCT 36.2 11/24/2019   MCV 90.3 11/24/2019   PLT 246 11/24/2019    Assessment / Plan: 56 y.31 at [redacted]w[redacted]d admitted for IOL for gHTN v Pre-E w/o SF in the context of chronic proteinuria  Labor:S/p Cytotec x1 and FB. Pit started at 1945, was at 10 before IV access was lost. Restart at 6 units. Consider AROM if appropriate. Fetal Wellbeing:Category I Pain Control:Comfortable with epidural I/D:GBS negative Anticipated [redacted]w[redacted]d GHTN v Pre-E w/o sf: No severe range BP and asymptomatic. Labs significant for P/C ratio 0.46. creatinine 1.16. Does have chronic proteinuria Chronic proteinuria:Nephro consult PP  Ajdin Macke L Cattaleya Wien DO OB Fellow, [facility] 11/25/2019, 1:47 AM

## 2019-11-26 DIAGNOSIS — O135 Gestational [pregnancy-induced] hypertension without significant proteinuria, complicating the puerperium: Secondary | ICD-10-CM

## 2019-11-26 MED ORDER — IBUPROFEN 600 MG PO TABS: 600.0000 mg | ORAL_TABLET | Freq: Three times a day (TID) | ORAL | 0 refills | Status: AC | PRN

## 2019-11-26 MED ORDER — NORETHINDRONE 0.35 MG PO TABS
1.0000 | ORAL_TABLET | Freq: Every day | ORAL | 3 refills | Status: AC
Start: 2019-11-26 — End: 2020-11-25

## 2019-11-26 NOTE — Progress Notes (Signed)
Pt c/o dizziness, VS WNL. Lochia scant. Dr Ronna Polio aware.

## 2019-11-26 NOTE — Progress Notes (Signed)
Brief Note: - renal US reviewed.  No abnormalities - OK from renal perspective to d/c with followup with Dr Hyman Hopes in clinic. Please see his consult note.  Bufford Buttner MD [employer] [phone]

## 2020-12-16 ENCOUNTER — Ambulatory Visit
Admission: EM | Admit: 2020-12-16 | Discharge: 2020-12-16 | Disposition: A | Discharge status: Home / Self Care | Payer: Self-pay

## 2020-12-16 DIAGNOSIS — R300 Dysuria: Secondary | ICD-10-CM | POA: Insufficient documentation

## 2020-12-16 DIAGNOSIS — N3000 Acute cystitis without hematuria: Secondary | ICD-10-CM | POA: Insufficient documentation

## 2020-12-16 LAB — POCT URINALYSIS DIP (MANUAL ENTRY)
Bilirubin, UA: NEGATIVE
Glucose, UA: NEGATIVE mg/dL
Ketones, POC UA: NEGATIVE mg/dL
Leukocytes, UA: NEGATIVE
Nitrite, UA: NEGATIVE
Protein Ur, POC: 30 mg/dL — AB
Spec Grav, UA: 1.02 (ref 1.010–1.025)
Urobilinogen, UA: 0.2 E.U./dL
pH, UA: 6 (ref 5.0–8.0)

## 2020-12-16 LAB — POCT URINE PREGNANCY: Preg Test, Ur: NEGATIVE

## 2020-12-16 MED ORDER — CEPHALEXIN 500 MG PO CAPS
500.0000 mg | ORAL_CAPSULE | Freq: Two times a day (BID) | ORAL | 0 refills | Status: DC
Start: 2020-12-16 — End: 2022-12-15

## 2020-12-16 NOTE — ED Provider Notes (Signed)
[facility]   MRN: [id number] DOB: 1986-01-18  Subjective:   Ariana Downs is a 35 y.o. female presenting for 4-day history of acute onset dysuria, right flank pain, right lower lateral abdominal pain. Denies fever, nausea, vomiting, vaginal discharge, concern for STI.  No history of renal stones.  She does have a history of frequent UTIs.  She does see a nephrologist but has never been evaluated for frequent UTIs.  Hydrates with plain water.  Limits urinary irritants.  No current facility-administered medications for this encounter.  Current Outpatient Medications:    acetaminophen (TYLENOL) 500 MG tablet, Take 1,000 mg by mouth every 6 (six) hours as needed for moderate pain or headache., Disp: , Rfl:    ibuprofen (ADVIL) 600 MG tablet, Take 1 tablet (600 mg total) by mouth every 8 (eight) hours as needed for mild pain., Disp: 30 tablet, Rfl: 0   losartan (COZAAR) 25 MG tablet, Take 25 mg by mouth daily., Disp: , Rfl:    norethindrone (ORTHO MICRONOR) 0.35 MG tablet, Take 1 tablet (0.35 mg total) by mouth daily., Disp: 90 tablet, Rfl: 3   prenatal vitamin w/FE, FA (PRENATAL 1 + 1) 27-1 MG TABS tablet, Take 1 tablet by mouth daily at 12 noon., Disp: , Rfl:    No Known Allergies  Past Medical History:  Diagnosis Date   Headache    Vertigo      Past Surgical History:  Procedure Laterality Date   NO PAST SURGERIES      Family History  Problem Relation Age of Onset   Diabetes Mother     Social History   Tobacco Use   Smoking status: Never   Smokeless tobacco: Never  Vaping Use   Vaping Use: Never used  Substance Use Topics   Alcohol use: No   Drug use: No    ROS   Objective:   Vitals: BP 117/78 (BP Location: Left Arm)   Pulse 84   Temp 98.6 F (37 C) (Oral)   Resp 18   SpO2 98%   Breastfeeding No   Physical Exam Constitutional:      General: She is not in acute distress.    Appearance: Normal appearance. She is well-developed. She  is obese. She is not ill-appearing, toxic-appearing or diaphoretic.  HENT:     Head: Normocephalic and atraumatic.     Nose: Nose normal.     Mouth/Throat:     Mouth: Mucous membranes are moist.     Pharynx: Oropharynx is clear.  Eyes:     General: No scleral icterus.       Right eye: No discharge.        Left eye: No discharge.     Extraocular Movements: Extraocular movements intact.     Conjunctiva/sclera: Conjunctivae normal.     Pupils: Pupils are equal, round, and reactive to light.  Cardiovascular:     Rate and Rhythm: Normal rate.  Pulmonary:     Effort: Pulmonary effort is normal.  Abdominal:     General: Bowel sounds are normal. There is no distension.     Palpations: Abdomen is soft. There is no mass.     Tenderness: There is no abdominal tenderness. There is no right CVA tenderness, left CVA tenderness, guarding or rebound.  Skin:    General: Skin is warm and dry.  Neurological:     General: No focal deficit present.     Mental Status: She is alert and oriented to person, place, and time.  Psychiatric:        Mood and Affect: Mood normal.        Behavior: Behavior normal.        Thought Content: Thought content normal.        Judgment: Judgment normal.    Results for orders placed or performed during the hospital encounter of 12/16/20 (from the past 24 hour(s))  POCT urinalysis dipstick     Status: Abnormal   Collection Time: 12/16/20 12:35 PM  Result Value Ref Range   Color, UA yellow yellow   Clarity, UA clear clear   Glucose, UA negative negative mg/dL   Bilirubin, UA negative negative   Ketones, POC UA negative negative mg/dL   Spec Grav, UA 1.610 9.604 - 1.025   Blood, UA trace-intact (A) negative   pH, UA 6.0 5.0 - 8.0   Protein Ur, POC =30 (A) negative mg/dL   Urobilinogen, UA 0.2 0.2 or 1.0 E.U./dL   Nitrite, UA Negative Negative   Leukocytes, UA Negative Negative  POCT urine pregnancy     Status: None   Collection Time: 12/16/20 12:40 PM  Result  Value Ref Range   Preg Test, Ur Negative Negative    Assessment and Plan :   PDMP not reviewed this encounter.  1. Acute cystitis without hematuria   2. Dysuria     Start Keflex to cover for acute cystitis, urine culture pending.  Recommended aggressive hydration, limiting urinary irritants.  Also provided her with information to [facility] for further evaluation of frequent UTIs.  Counseled patient on potential for adverse effects with medications prescribed/recommended today, ER and return-to-clinic precautions discussed, patient verbalized understanding.    Wallis Bamberg, PA-C 12/16/20 1430

## 2020-12-16 NOTE — ED Triage Notes (Signed)
Three to four day h/o right flank pain, abdominal pain and dysuria Denies hematuria, urinary frequency and urgency. No foul odor, vaginal itching or discharge. No meds taken.

## 2020-12-18 LAB — URINE CULTURE

## 2021-03-10 ENCOUNTER — Ambulatory Visit
Admission: EM | Admit: 2021-03-10 | Discharge: 2021-03-10 | Disposition: A | Discharge status: Home / Self Care | Payer: Self-pay | Attending: Urgent Care | Admitting: Urgent Care

## 2021-03-10 ENCOUNTER — Encounter: Payer: Self-pay | Admitting: Emergency Medicine

## 2021-03-10 ENCOUNTER — Other Ambulatory Visit: Payer: Self-pay

## 2021-03-10 DIAGNOSIS — M79605 Pain in left leg: Secondary | ICD-10-CM

## 2021-03-10 NOTE — ED Triage Notes (Signed)
Patient c/o left lower leg pain x 3 months.  No injury to the leg.  The leg is "hot" inside.  Patient denies any OTC pain meds.

## 2021-03-10 NOTE — Discharge Instructions (Signed)
Por favor llame la clinica para pedir un ultrasonido de su pie manana. Por lo mientras puede tomar 500mg -650mg  de Tylenol cada 6 horas para dolor, fiebre.

## 2021-03-10 NOTE — ED Provider Notes (Signed)
[facility]   MRN: [id number] DOB: 04/07/1986  Subjective:   Ariana Downs is a 35 y.o. female presenting for 92-month history of persistent left lower leg pain with swelling.  Patient states that she feels like it is hot internally.  No history of DVT.  No redness, warmth, swelling, trauma, falls.  No history of DVT.  No current facility-administered medications for this encounter.  Current Outpatient Medications:    losartan (COZAAR) 25 MG tablet, Take 25 mg by mouth daily., Disp: , Rfl:    acetaminophen (TYLENOL) 500 MG tablet, Take 1,000 mg by mouth every 6 (six) hours as needed for moderate pain or headache., Disp: , Rfl:    cephALEXin (KEFLEX) 500 MG capsule, Take 1 capsule (500 mg total) by mouth 2 (two) times daily., Disp: 10 capsule, Rfl: 0   ibuprofen (ADVIL) 600 MG tablet, Take 1 tablet (600 mg total) by mouth every 8 (eight) hours as needed for mild pain., Disp: 30 tablet, Rfl: 0   norethindrone (ORTHO MICRONOR) 0.35 MG tablet, Take 1 tablet (0.35 mg total) by mouth daily., Disp: 90 tablet, Rfl: 3   prenatal vitamin w/FE, FA (PRENATAL 1 + 1) 27-1 MG TABS tablet, Take 1 tablet by mouth daily at 12 noon., Disp: , Rfl:    No Known Allergies  Past Medical History:  Diagnosis Date   Headache    Vertigo      Past Surgical History:  Procedure Laterality Date   NO PAST SURGERIES      Family History  Problem Relation Age of Onset   Diabetes Mother     Social History   Tobacco Use   Smoking status: Never   Smokeless tobacco: Never  Vaping Use   Vaping Use: Never used  Substance Use Topics   Alcohol use: No   Drug use: No    ROS   Objective:   Vitals: BP 110/78 (BP Location: Left Arm)   Pulse 64   Temp 97.6 F (36.4 C) (Oral)   Ht 5\' 1"  (1.549 m)   Wt 200 lb (90.7 kg)   LMP 02/28/2021   SpO2 99%   BMI 37.79 kg/m   Physical Exam Constitutional:      General: She is not in acute distress.    Appearance: Normal appearance. She  is well-developed. She is not ill-appearing, toxic-appearing or diaphoretic.  HENT:     Head: Normocephalic and atraumatic.     Nose: Nose normal.     Mouth/Throat:     Mouth: Mucous membranes are moist.     Pharynx: Oropharynx is clear.  Eyes:     General: No scleral icterus.       Right eye: No discharge.        Left eye: No discharge.     Extraocular Movements: Extraocular movements intact.     Conjunctiva/sclera: Conjunctivae normal.     Pupils: Pupils are equal, round, and reactive to light.  Cardiovascular:     Rate and Rhythm: Normal rate.  Pulmonary:     Effort: Pulmonary effort is normal.  Musculoskeletal:     Left lower leg: Tenderness (Over areas outlined) present. No swelling, deformity, lacerations or bony tenderness. No edema.       Legs:  Skin:    General: Skin is warm and dry.  Neurological:     General: No focal deficit present.     Mental Status: She is alert and oriented to person, place, and time.  Psychiatric:  Mood and Affect: Mood normal.        Behavior: Behavior normal.        Thought Content: Thought content normal.        Judgment: Judgment normal.      Assessment and Plan :   PDMP not reviewed this encounter.  1. Left leg pain     Will pursue U/S of the left lower leg tomorrow to rule out dvt. Low suspicion for this but given patient's concern, unilateral leg pain will try to rule this out. In the meantime, use APAP for pain only. Counseled patient on potential for adverse effects with medications prescribed/recommended today, ER and return-to-clinic precautions discussed, patient verbalized understanding.    Wallis Bamberg, [place] 03/11/21 [phone]

## 2021-03-11 ENCOUNTER — Encounter (HOSPITAL_COMMUNITY): Payer: Self-pay

## 2021-04-07 ENCOUNTER — Other Ambulatory Visit: Payer: Self-pay | Admitting: Urgent Care

## 2021-10-05 IMAGING — US US MFM OB FOLLOW-UP
1 series · 14 of 28 positions shown · non-contrast
Comparison: none

[Series 1: us mfm ob follow-up · 37 acquisitions, 14 frames shown]
[im 2/37]
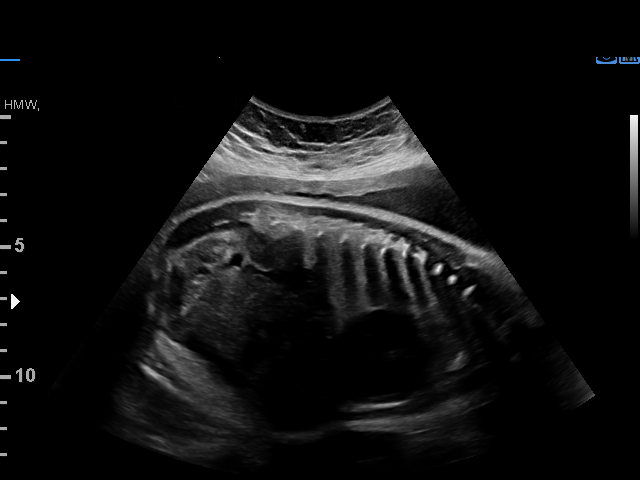
[im 5/37]
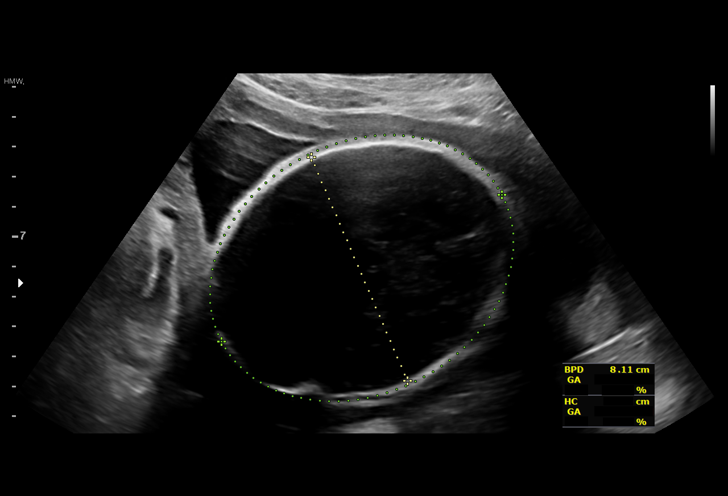
[im 7/37]
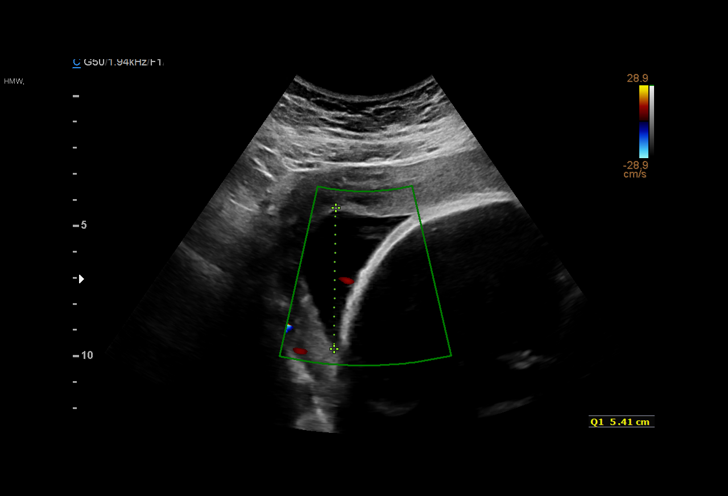
[im 10/37]
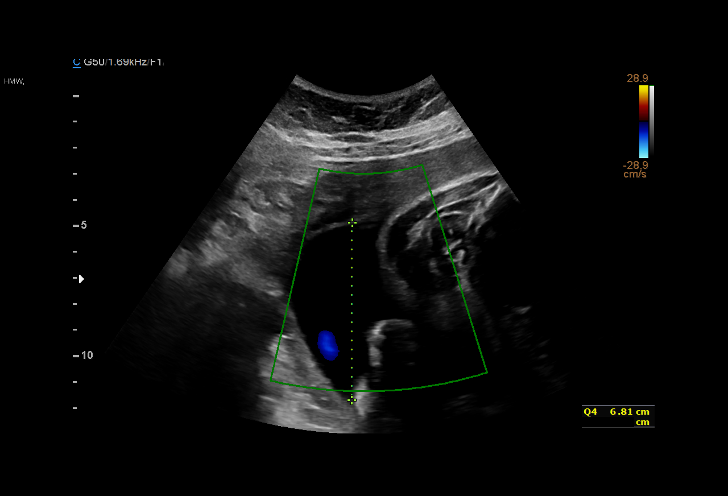
[im 13/37]
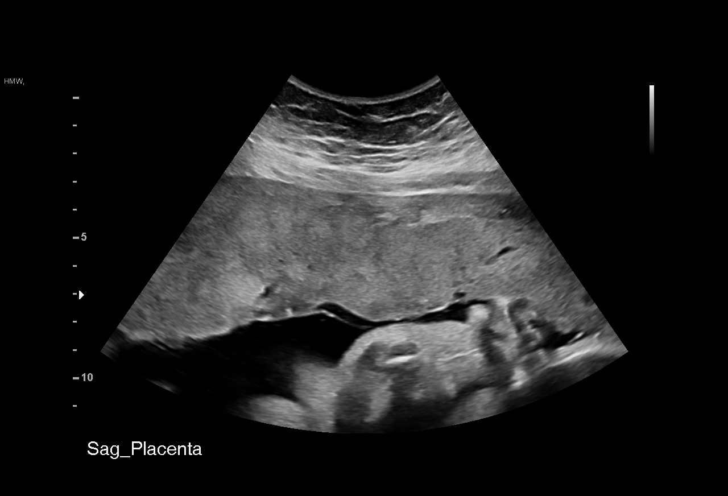
[im 15/37]
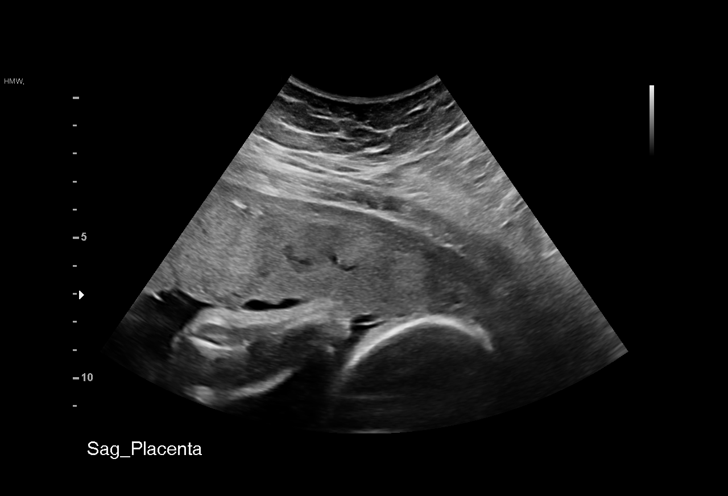
[im 18/37]
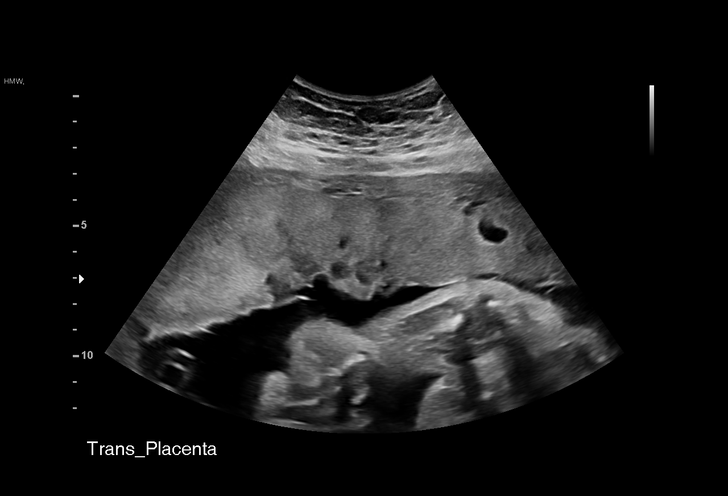
[im 21/37]
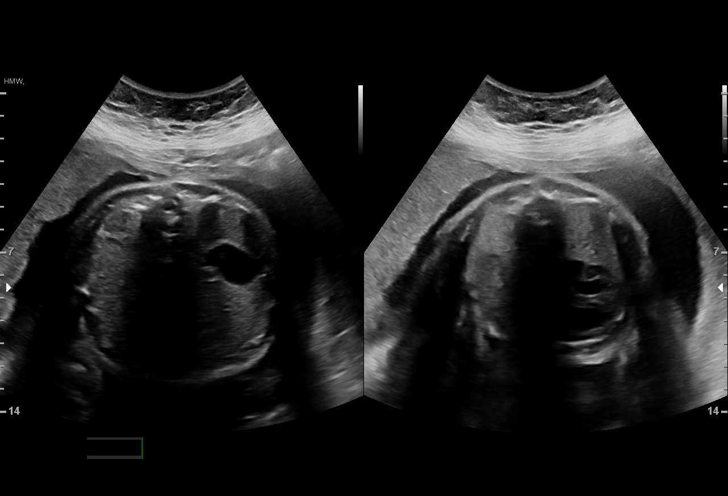
[im 23/37]
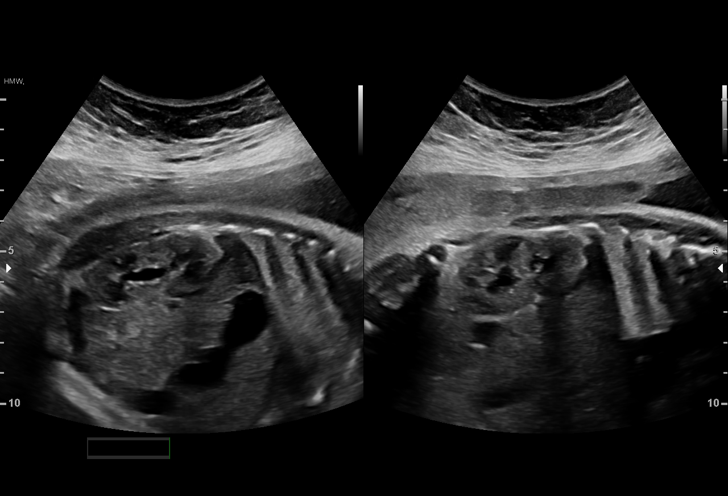
[im 26/37]
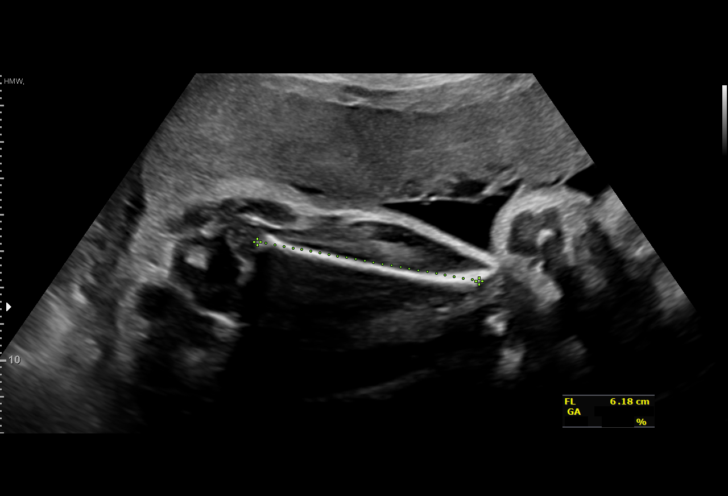
[im 29/37]
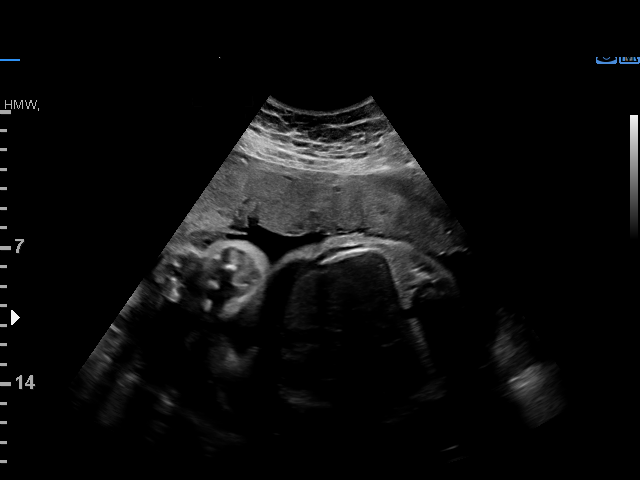
[im 31/37]
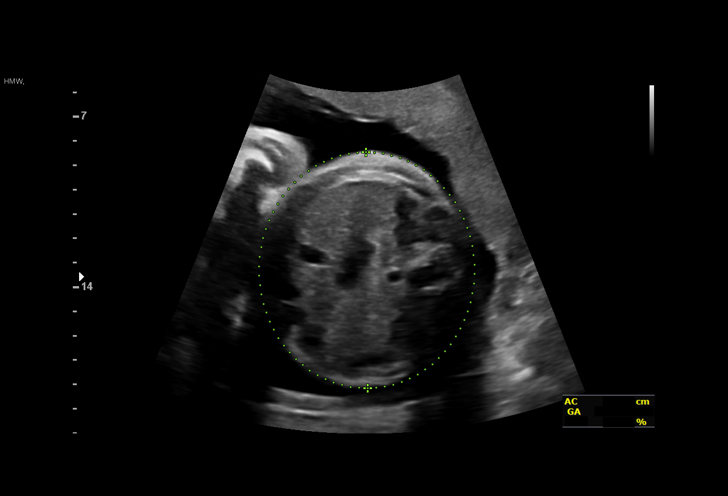
[im 34/37]
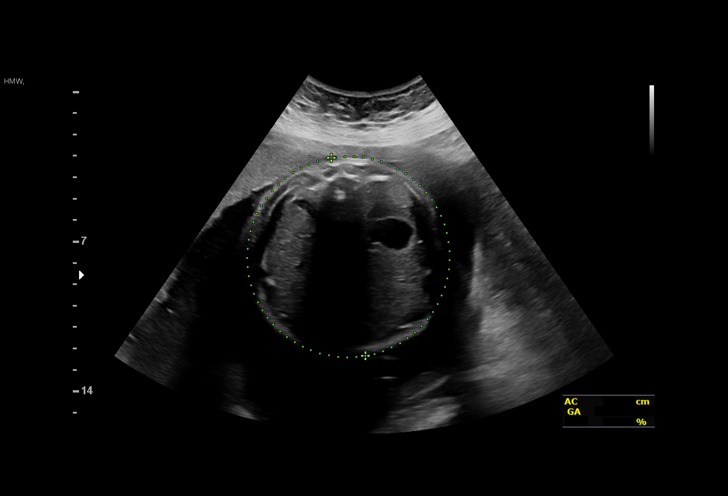
[im 37/37]
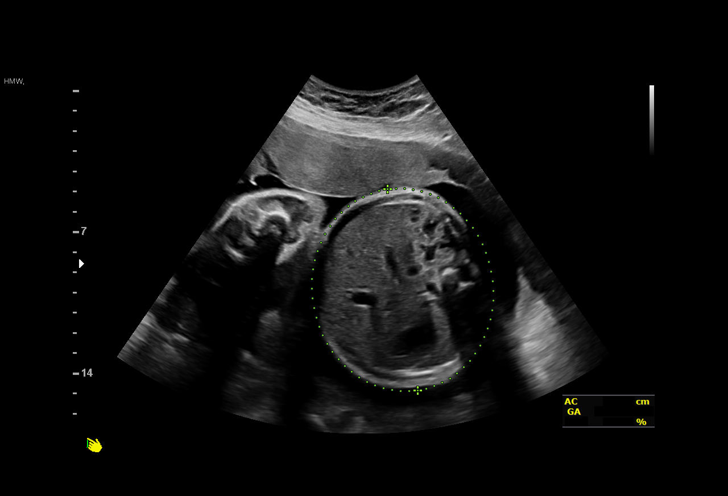

[14 of 28 positions shown; findings below may reference images not displayed]

[facility]

                    [REDACTED] Department
                    NP

Indications

 Marginal insertion of umbilical cord affecting
 management of mother in second trimester
 32 weeks gestation of pregnancy
 Antenatal screening for malformations
 Obesity complicating pregnancy, second
 trimester (pregravid BMI 42)
Vital Signs

                                                 Height:        5'1"
Fetal Evaluation

 Num Of Fetuses:          1
 Fetal Heart Rate(bpm):   137
 Cardiac Activity:        Observed
 Placenta:                Anterior
 P. Cord Insertion:       Marginal insertion

 Amniotic Fluid
 AFI FV:      Within normal limits

 AFI Sum(cm)     %Tile       Largest Pocket(cm)
 21.8            84

 RUQ(cm)       RLQ(cm)        LUQ(cm)        LLQ(cm)

Biometry

 BPD:      81.8   mm     G. Age:  32w 6d         64  %    CI:         75.33  %    70 - 86
                                                          FL/HC:       20.6  %    19.1 -
 HC:      298.9   mm     G. Age:  33w 1d         38  %    HC/AC:       1.01       0.96 -
 AC:      296.1   mm     G. Age:  33w 4d         86  %    FL/BPD:      75.3  %    71 - 87
 FL:       61.6   mm     G. Age:  32w 0d         32  %    FL/AC:       20.8  %    20 - 24

 Est. FW:    7357   gm    4 lb 10 oz      68  %
OB History

 Gravidity:     4         Term:  2          Prem:  0        SAB:   1
 TOP:           0       Ectopic: 0         Living: 2
Gestational Age

 LMP:            32w 1d       Date:  03/05/19                   EDD:  12/10/19
 U/S Today:      32w 6d                                         EDD:  12/05/19
 Best:           32w 1d    Det. By:  LMP  (03/05/19)            EDD:  12/10/19
Anatomy

 Cranium:                Appears normal         LVOT:                   Previously seen
 Cavum:                  Previously seen        Aortic Arch:            Previously seen
 Ventricles:             Appears normal         Ductal Arch:            Previously seen
 Choroid Plexus:         Previously seen        Diaphragm:              Previously seen
 Cerebellum:             Previously seen        Stomach:                Appears normal, left
                                                                        sided
 Posterior Fossa:        Previously seen        Abdomen:                Previously seen
 Nuchal Fold:            Not applicable (>20    Abdominal Wall:         Previously seen
                         wks GA)
 Face:                   Orbits and profile     Cord Vessels:           Previously seen
                         previously seen
 Lips:                   Previously seen        Kidneys:                Appear normal
 Palate:                 Previously seen        Bladder:                Appears normal
 Thoracic:               Appears normal         Spine:                  Previously seen
 Heart:                  Appears normal         Upper Extremities:      Previously seen
                         (4CH, axis, and situs)
 RVOT:                   Appears normal         Lower Extremities:      Previously seen

 Other:   Technically difficult due to maternal habitus and fetal position.
Cervix Uterus Adnexa

 Cervix
 Not visualized (advanced GA >11wks)

 Uterus
 No abnormality visualized.

 Right Ovary
 No adnexal mass visualized.

 Left Ovary
 No adnexal mass visualized.
 Cul De Sac
 No free fluid seen.

 Adnexa
 No abnormality visualized.
Impression

 Follow up for marginal cord insertion
 Normal interval growth
 Good fetal movement and amniotic fluid
Recommendations

 Follow up growth in 4 weeks.

## 2021-11-03 IMAGING — US US MFM OB FOLLOW-UP
1 series · 14 of 28 positions shown · non-contrast
Comparison: none

[Series 1: us mfm ob follow-up · 50 acquisitions, 14 frames shown]
[im 2/50]
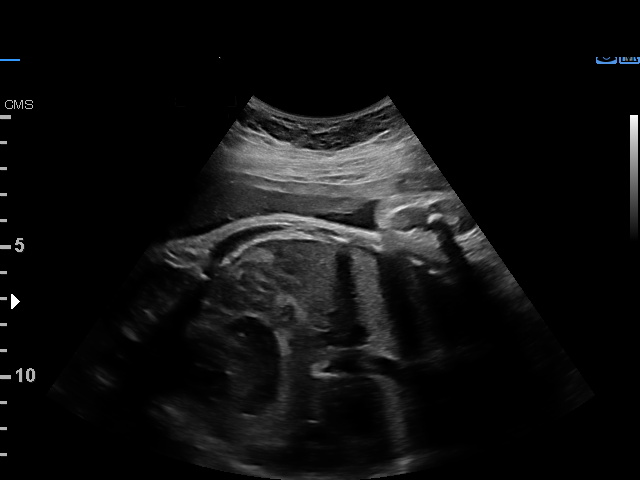
[im 6/50]
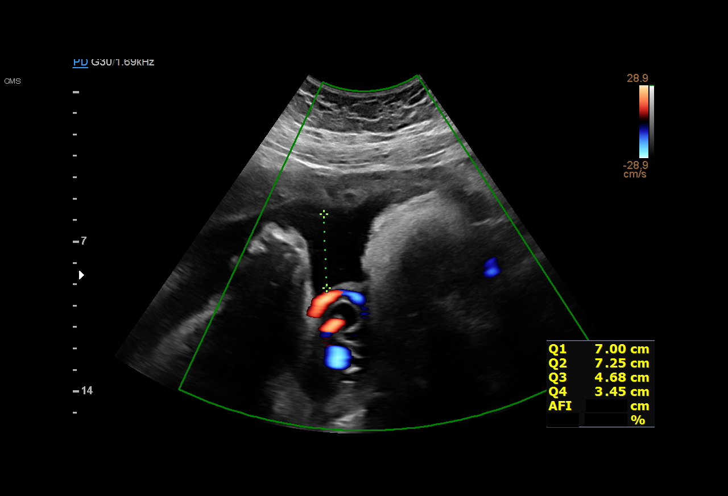
[im 10/50]
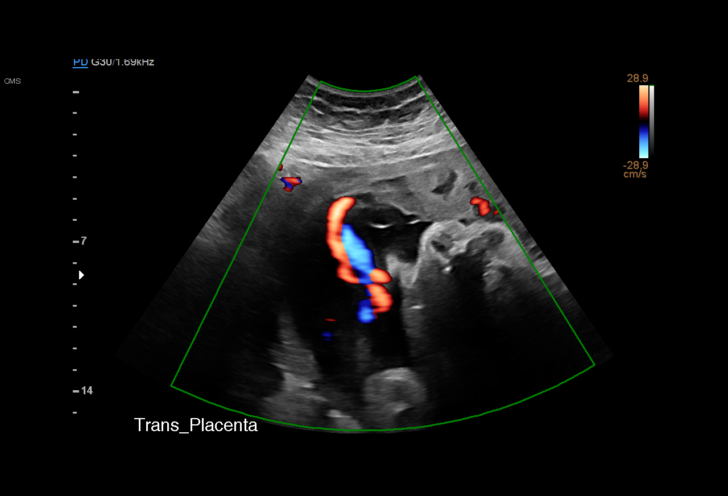
[im 13/50]
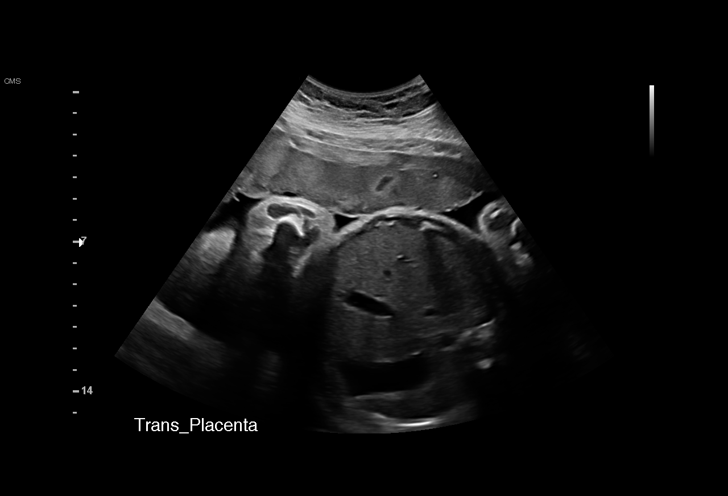
[im 17/50]
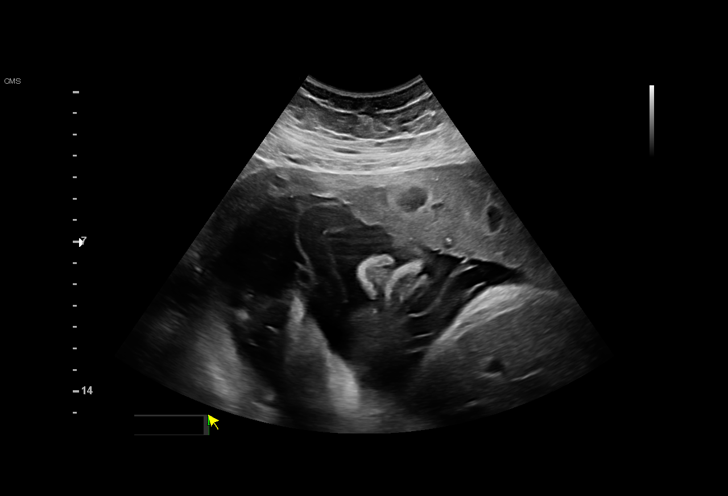
[im 20/50]
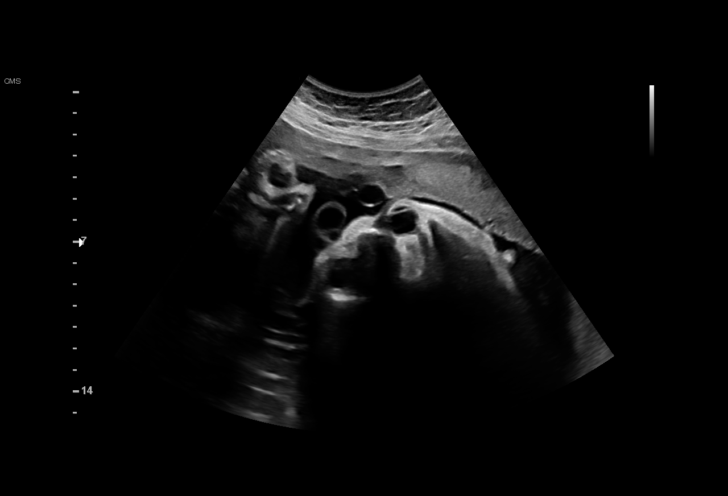
[im 24/50]
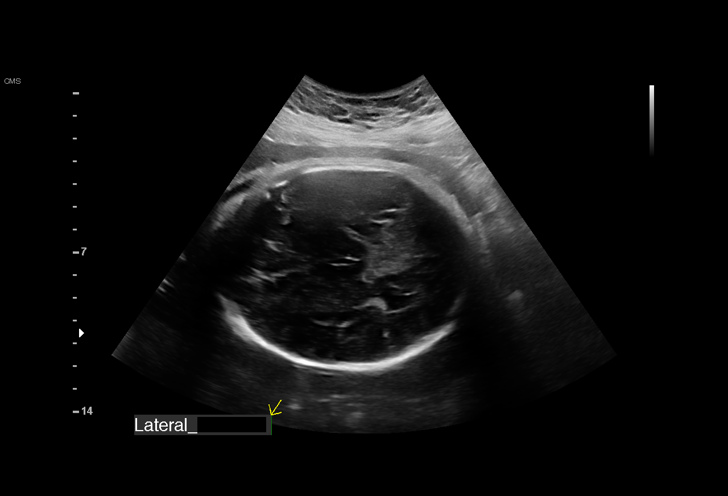
[im 28/50]
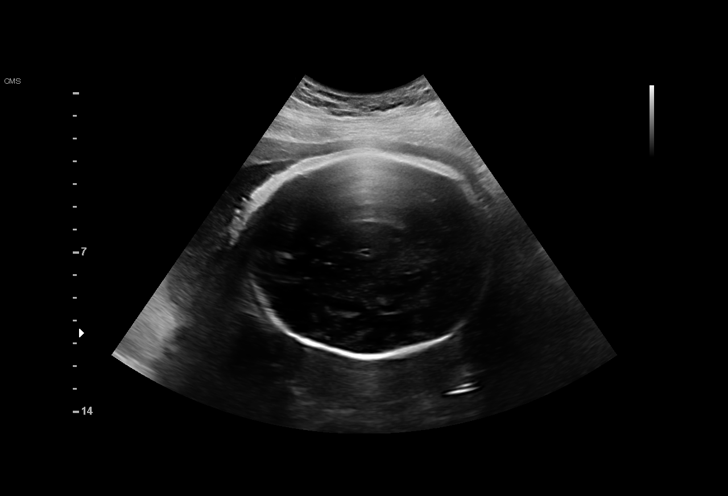
[im 31/50]
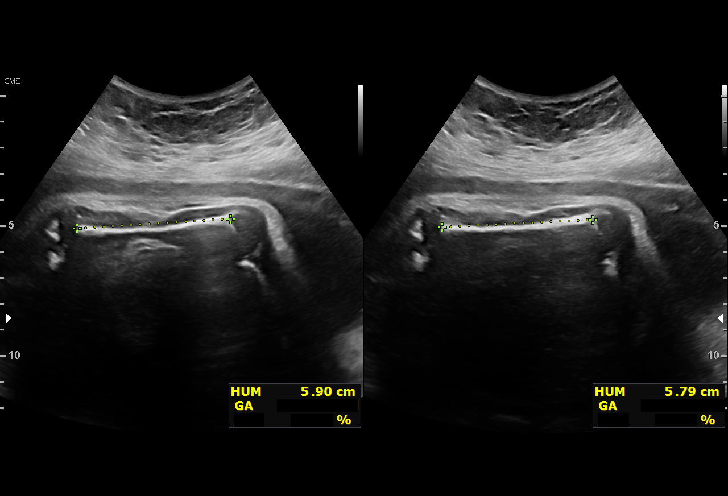
[im 35/50]
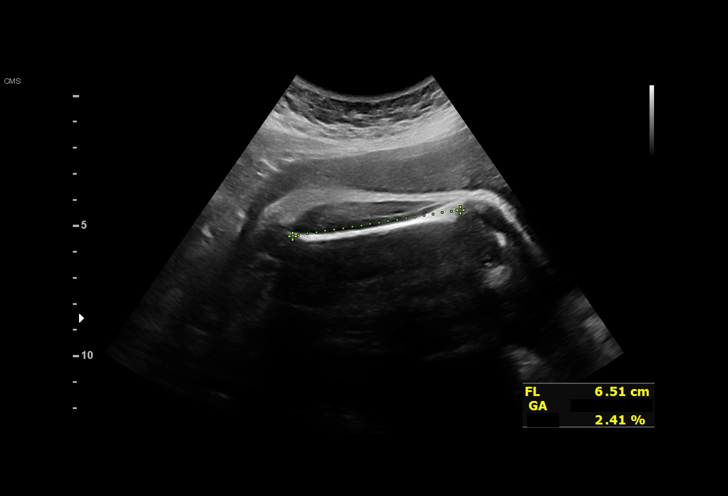
[im 39/50]
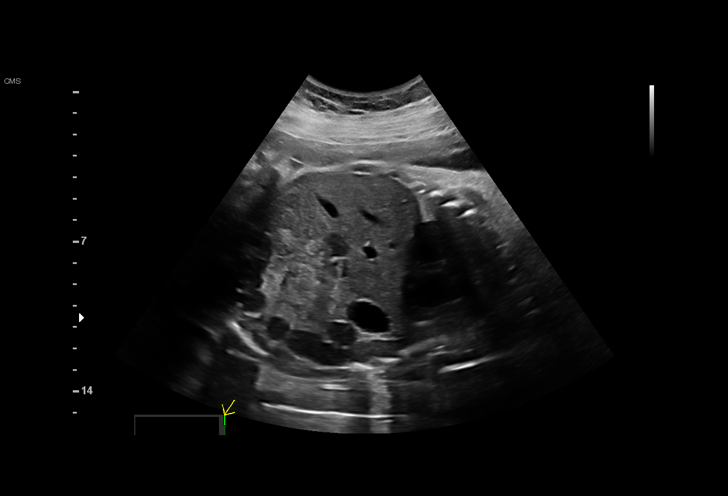
[im 42/50]
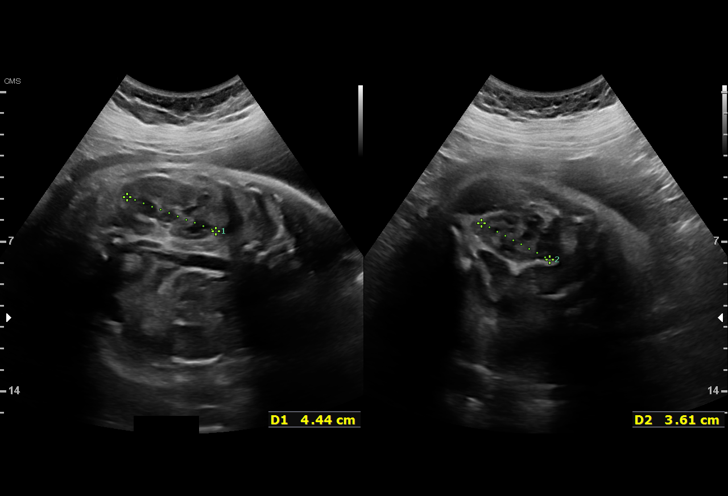
[im 46/50]
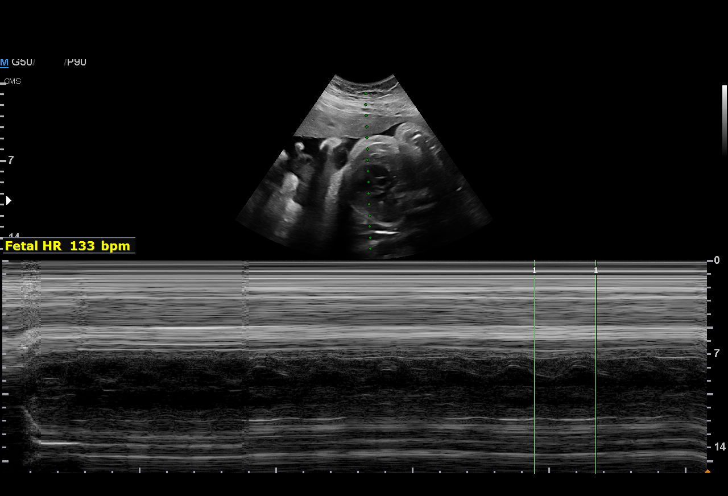
[im 50/50]
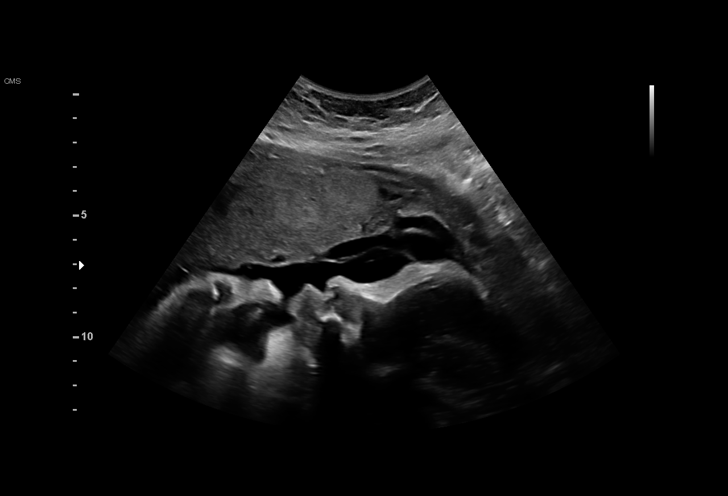

[14 of 28 positions shown; findings below may reference images not displayed]

BRAVO

                    [REDACTED] Department
                    NP

Indications

 Encounter for other antenatal screening
 follow-up
 Marginal insertion of umbilical cord affecting
 management of mother in third trimester
 Obesity complicating pregnancy, third
 trimester (pregravid BMI 42)
 36 weeks gestation of pregnancy
Vital Signs

                                                 Height:        5'1"
Fetal Evaluation

 Num Of Fetuses:          1
 Fetal Heart Rate(bpm):   133
 Cardiac Activity:        Observed
 Presentation:            Cephalic
 Placenta:                Anterior
 P. Cord Insertion:       Marginal insertion

 Amniotic Fluid
 AFI FV:      Within normal limits

 AFI Sum(cm)     %Tile       Largest Pocket(cm)
 22.5            86

 RUQ(cm)       RLQ(cm)        LUQ(cm)        LLQ(cm)
 7
Biometry

 BPD:      89.4   mm     G. Age:  36w 1d         58  %    CI:         73.74  %    70 - 86
                                                          FL/HC:       19.7  %    20.1 -
 HC:      330.7   mm     G. Age:  37w 5d         55  %    HC/AC:       0.99       0.93 -
 AC:      334.6   mm     G. Age:  37w 3d         87  %    FL/BPD:      73.0  %    71 - 87
 FL:       65.3   mm     G. Age:  33w 5d        2.7  %    FL/AC:       19.5  %    20 - 24
 HUM:      58.5   mm     G. Age:  33w 6d         23  %

 Est. FW:    6505   gm      6 lb 7 oz     55  %
OB History

 Gravidity:     4         Term:  2          Prem:  0        SAB:   1
 TOP:           0       Ectopic: 0         Living: 2
Gestational Age

 LMP:            36w 2d       Date:  03/05/19                   EDD:  12/10/19
 U/S Today:      36w 2d                                         EDD:  12/10/19
 Best:           36w 2d    Det. By:  LMP  (03/05/19)            EDD:  12/10/19
Anatomy

 Cranium:                Appears normal         LVOT:                   Previously seen
 Cavum:                  Appears normal         Aortic Arch:            Previously seen
 Ventricles:             Appears normal         Ductal Arch:            Previously seen
 Choroid Plexus:         Previously seen        Diaphragm:              Appears normal
 Cerebellum:             Appears normal         Stomach:                Appears normal, left
                                                                        sided
 Posterior Fossa:        Previously seen        Abdomen:                Previously seen
 Nuchal Fold:            Not applicable (>20    Abdominal Wall:         Previously seen
                         wks GA)
 Face:                   Appears normal         Cord Vessels:           Previously seen
                         (orbits and profile)
 Lips:                   Appears normal         Kidneys:                Appear normal
 Palate:                 Previously seen        Bladder:                Appears normal
 Thoracic:               Appears normal         Spine:                  Previously seen
 Heart:                  Appears normal         Upper Extremities:      Previously seen
                         (4CH, axis, and situs)
 RVOT:                   Previously seen        Lower Extremities:      Previously seen

 Other:   Technically difficult due to maternal habitus and fetal position.
Cervix Uterus Adnexa

 Cervix
 Not visualized (advanced GA >06wks)

 Right Ovary
 Not visualized.

 Left Ovary
 Within normal limits.
 Cul De Sac
 No free fluid seen.

 Adnexa
 No abnormality visualized.
Impression

 Follow up growth given marginal cord insertion
 Normal interval growth with measurements consistent with
 dates.
Recommendations

 Follow up as clinically indicated.

## 2021-11-14 IMAGING — US US RENAL
1 series · 15 of 25 positions shown · non-contrast
Comparison: None.

CLINICAL DATA: Acute renal failure.  Recent vaginal delivery

EXAM:
RENAL / URINARY TRACT ULTRASOUND COMPLETE

[Series 1: us renal · 15 of 30 slices shown]
[im 1/30]
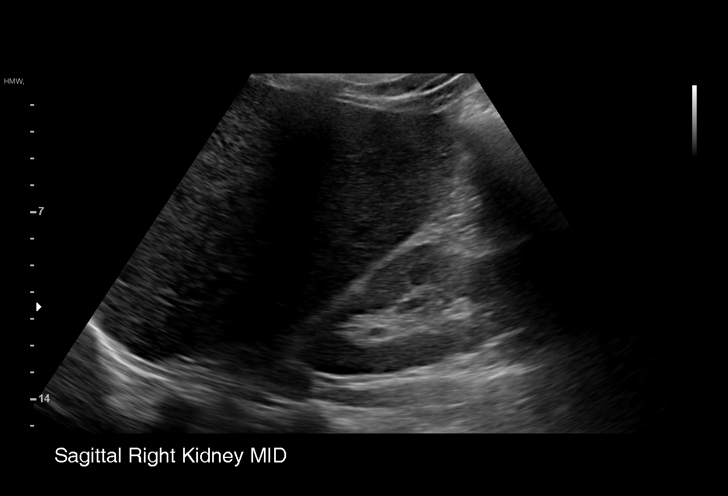
[im 3/30]
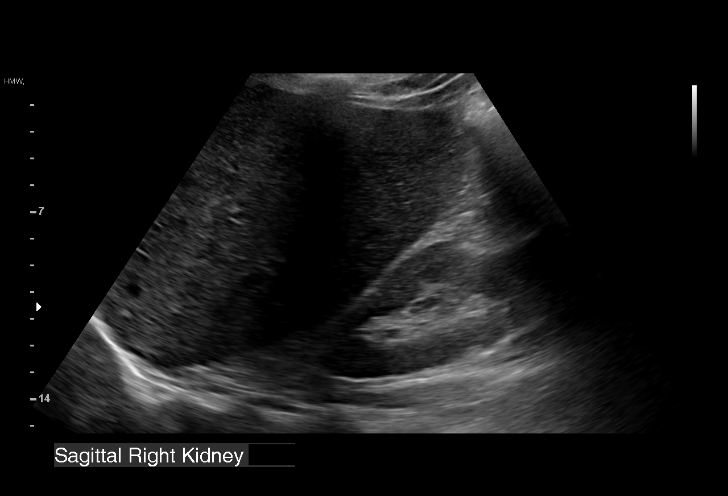
[im 5/30]
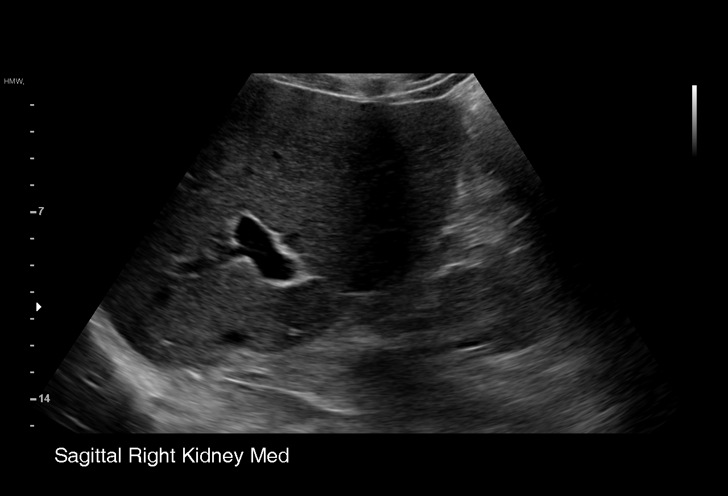
[im 7/30]
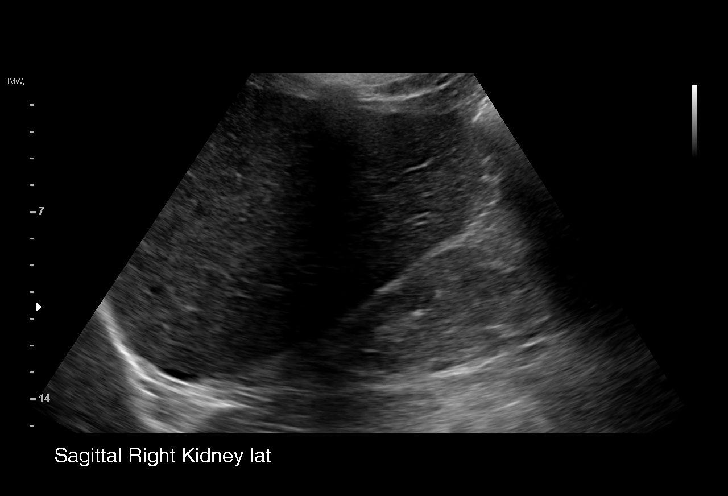
[im 9/30]
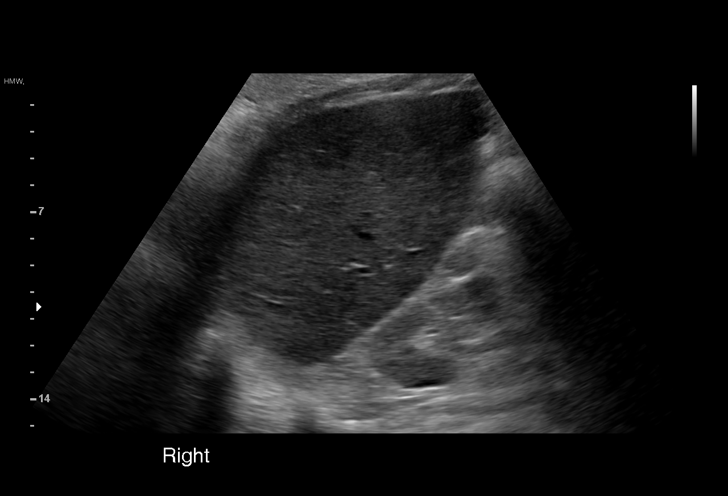
[im 11/30]
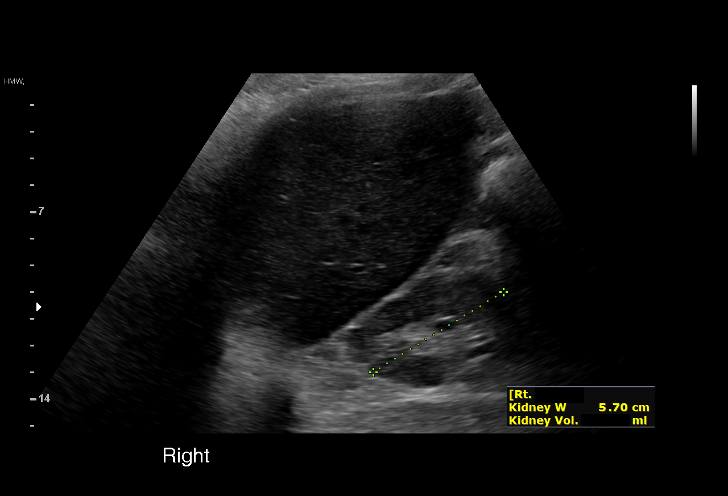
[im 13/30]
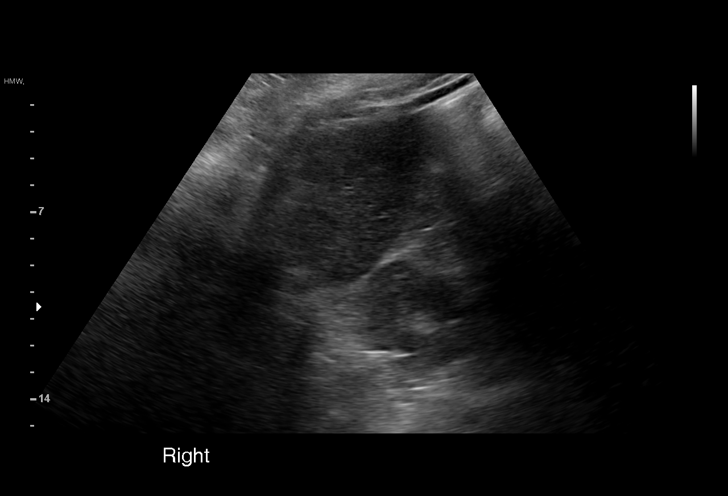
[im 15/30]
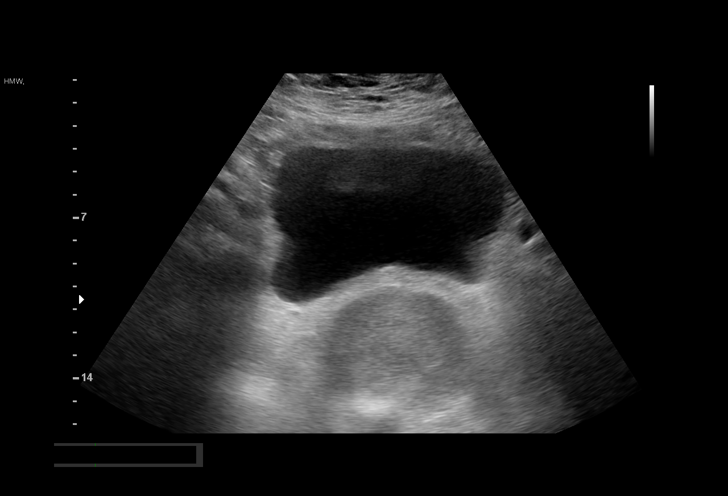
[im 17/30]
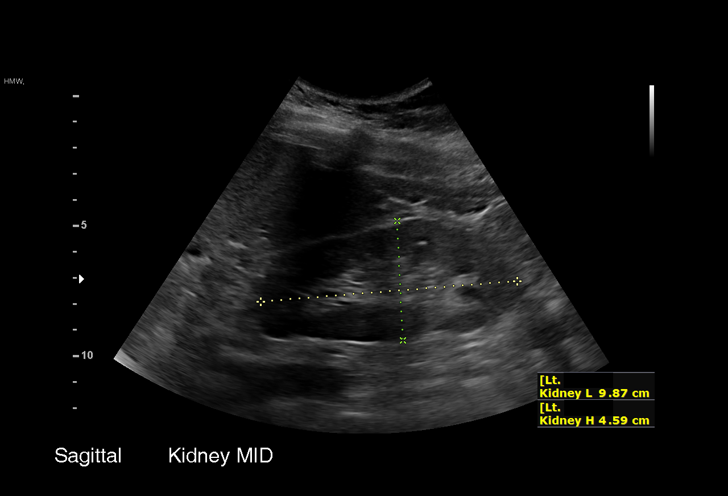
[im 19/30]
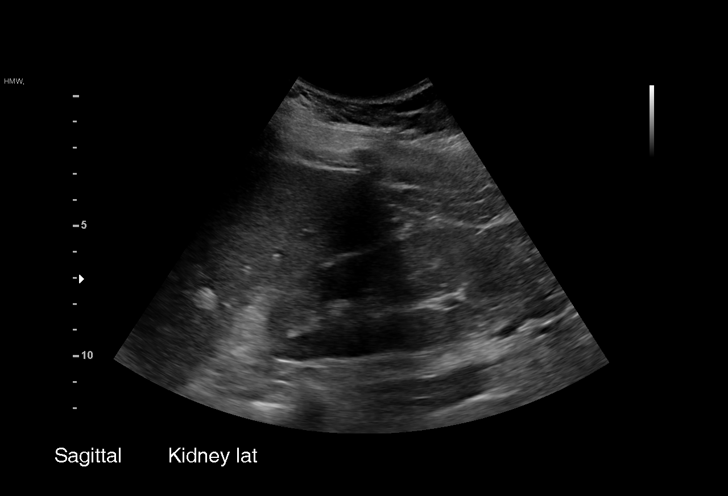
[im 21/30]
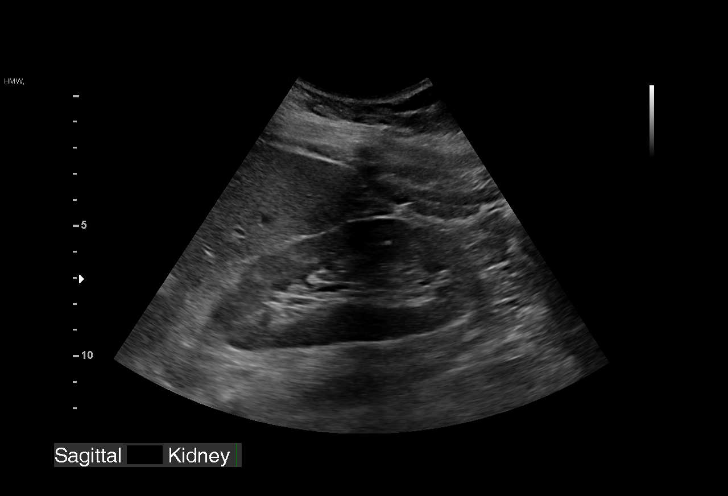
[im 23/30]
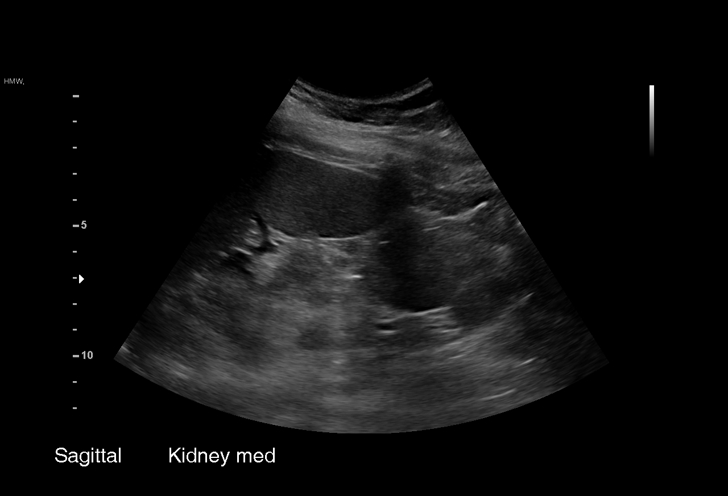
[im 25/30]
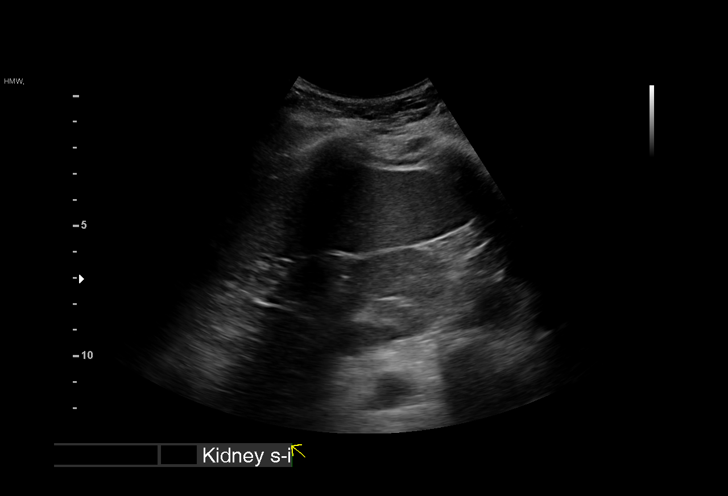
[im 27/30]
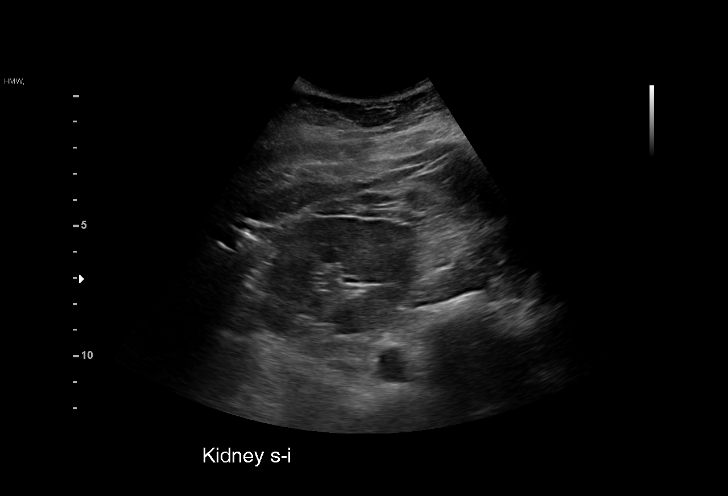
[im 30/30]
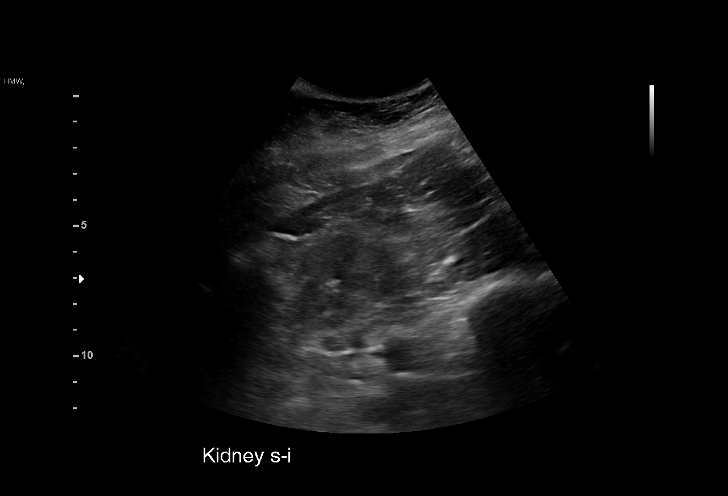

[15 of 25 positions shown; findings below may reference images not displayed]

FINDINGS: Right Kidney:

Renal measurements: 8.7 x 4.0 x 5.7 cm = volume: 106 mL .
Echogenicity within normal limits. No mass or hydronephrosis
visualized.

Left Kidney:

Renal measurements: 9.9 x 4.5 x 5.2 cm = volume: 122.5 mL.
Echogenicity within normal limits. No mass or hydronephrosis
visualized.

Bladder:

Appears normal for degree of bladder distention.

Other:

None.
IMPRESSION: 1. Normal kidneys.
2. No hydronephrosis.

## 2022-05-25 ENCOUNTER — Emergency Department (HOSPITAL_COMMUNITY)
Admission: EM | Admit: 2022-05-25 | Discharge: 2022-05-26 | Discharge status: Against Medical Advice | Payer: Self-pay | Attending: Student | Admitting: Student

## 2022-05-25 ENCOUNTER — Emergency Department (HOSPITAL_COMMUNITY): Payer: Self-pay

## 2022-05-25 ENCOUNTER — Encounter (HOSPITAL_COMMUNITY): Payer: Self-pay | Admitting: Emergency Medicine

## 2022-05-25 DIAGNOSIS — Z5321 Procedure and treatment not carried out due to patient leaving prior to being seen by health care provider: Secondary | ICD-10-CM | POA: Insufficient documentation

## 2022-05-25 DIAGNOSIS — R110 Nausea: Secondary | ICD-10-CM | POA: Insufficient documentation

## 2022-05-25 DIAGNOSIS — R109 Unspecified abdominal pain: Secondary | ICD-10-CM | POA: Insufficient documentation

## 2022-05-25 DIAGNOSIS — R6883 Chills (without fever): Secondary | ICD-10-CM | POA: Insufficient documentation

## 2022-05-25 DIAGNOSIS — R519 Headache, unspecified: Secondary | ICD-10-CM | POA: Insufficient documentation

## 2022-05-25 LAB — I-STAT BETA HCG BLOOD, ED (MC, WL, AP ONLY): I-stat hCG, quantitative: <5 m[IU]/mL (ref <5)

## 2022-05-25 NOTE — ED Provider Triage Note (Signed)
Emergency Medicine Provider Triage Evaluation Note  Ariana Downs , a 36 y.o. female  was evaluated in triage.  Pt complains of.  Started a few days ago, associate with nausea but no vomiting.  It is left-sided and left flank pain.  She is currently on her menstrual cycle so is having bleeding in her urine.  No abdominal surgeries..  Review of Systems  Per HPI  Physical Exam  BP (!) 163/105   Pulse 95   Temp 98.3 F (36.8 C) (Oral)   Resp 16   SpO2 100%  Gen:   Awake, no distress   Resp:  Normal effort  MSK:   Moves extremities without difficulty  Other:  Left upper quadrant tenderness, left CVA tenderness  Medical Decision Making  Medically screening exam initiated at 6:38 PM.  Appropriate orders placed.  Trichelle Lehan was informed that the remainder of the evaluation will be completed by another provider, this initial triage assessment does not replace that evaluation, and the importance of remaining in the ED until their evaluation is complete.     Theron Arista, PA-C 05/25/22 1839

## 2022-05-25 NOTE — ED Triage Notes (Signed)
Pt reports sharp left sided abd pain since Sunday night. Endorses chills, nausea and headache.

## 2022-05-26 NOTE — ED Notes (Signed)
Pt name called for updated vitals, no response 

## 2022-12-15 ENCOUNTER — Ambulatory Visit
Admission: RE | Admit: 2022-12-15 | Discharge: 2022-12-15 | Disposition: A | Discharge status: Home / Self Care | Payer: Self-pay | Source: Ambulatory Visit | Attending: Family Medicine | Admitting: Family Medicine

## 2022-12-15 VITALS — BP 116/76 | HR 72 | Temp 98.2°F | Resp 17

## 2022-12-15 DIAGNOSIS — R1030 Lower abdominal pain, unspecified: Secondary | ICD-10-CM | POA: Insufficient documentation

## 2022-12-15 DIAGNOSIS — Z8719 Personal history of other diseases of the digestive system: Secondary | ICD-10-CM | POA: Insufficient documentation

## 2022-12-15 LAB — POCT URINE PREGNANCY: Preg Test, Ur: NEGATIVE

## 2022-12-15 LAB — POCT URINALYSIS DIP (MANUAL ENTRY)
Bilirubin, UA: NEGATIVE
Glucose, UA: NEGATIVE mg/dL
Ketones, POC UA: NEGATIVE mg/dL
Nitrite, UA: NEGATIVE
Protein Ur, POC: 100 mg/dL — AB
Spec Grav, UA: 1.02 (ref 1.010–1.025)
Urobilinogen, UA: 1 E.U./dL
pH, UA: 6 (ref 5.0–8.0)

## 2022-12-15 MED ORDER — CIPROFLOXACIN HCL 500 MG PO TABS: 500.0000 mg | ORAL_TABLET | Freq: Two times a day (BID) | ORAL | 0 refills | Status: DC

## 2022-12-15 MED ORDER — METRONIDAZOLE 500 MG PO TABS: 500.0000 mg | ORAL_TABLET | Freq: Three times a day (TID) | ORAL | 0 refills | Status: DC

## 2022-12-15 NOTE — ED Triage Notes (Signed)
Pt presents with c/o lower abd pain and diarrhea x 6d. Pt reports pain while walking and bloating. States the pain she is experiencing now is similar to the pain she had when she had diverticulitis.   Pt states she has "colon pain"

## 2022-12-16 LAB — URINE CULTURE

## 2022-12-17 NOTE — ED Provider Notes (Signed)
[facility]   [id number] 12/15/22 Arrival Time: 1056  ASSESSMENT & PLAN:  1. Lower abdominal pain   2. History of diverticulitis    H/O diverticulitis with same. Overall benign abdominal exam.  Will tx empirically for diverticulitis. Agrees to ED eval should she worsen in any way or fail to begin improving over next 24-48 hours.  Meds ordered this encounter  Medications   ciprofloxacin (CIPRO) 500 MG tablet    Sig: Take 1 tablet (500 mg total) by mouth every 12 (twelve) hours.    Dispense:  20 tablet    Refill:  0   metroNIDAZOLE (FLAGYL) 500 MG tablet    Sig: Take 1 tablet (500 mg total) by mouth 3 (three) times daily.    Dispense:  30 tablet    Refill:  0   Results for orders placed or performed during the hospital encounter of 12/15/22  Urine Culture   Specimen: Urine, Clean Catch  Result Value Ref Range   Specimen Description URINE, CLEAN CATCH    Special Requests      NONE Performed at [facility], [address] [place], [place] [address]    Culture MULTIPLE SPECIES PRESENT, SUGGEST RECOLLECTION (A)    Report Status 12/16/2022 FINAL   POCT urinalysis dipstick  Result Value Ref Range   Color, UA light yellow (A) yellow   Clarity, UA cloudy (A) clear   Glucose, UA negative negative mg/dL   Bilirubin, UA negative negative   Ketones, POC UA negative negative mg/dL   Spec Grav, UA 1.914 7.829 - 1.025   Blood, UA large (A) negative   pH, UA 6.0 5.0 - 8.0   Protein Ur, POC =100 (A) negative mg/dL   Urobilinogen, UA 1.0 0.2 or 1.0 E.U./dL   Nitrite, UA Negative Negative   Leukocytes, UA Trace (A) Negative  POCT urine pregnancy  Result Value Ref Range   Preg Test, Ur Negative Negative      Follow-up Information     [facility].   Specialty: Emergency Medicine Why: If symptoms worsen in any way or if worsening or failing to improve as anticipated. Contact information: [address] [id number] mc [place] [address] [phone]               Reviewed expectations re: course of current medical issues. Questions answered. Outlined signs and symptoms indicating need for more acute intervention. Patient verbalized understanding. After Visit Summary given.   SUBJECTIVE: History from: patient. Ariana Downs is a 37 y.o. female who presents with complaint of lower left abd pain and non-bloody loose stools; x 5-6d. Pt reports pain while walking; with associated bloating. States the pain she is experiencing now is similar to the pain she had when she had diverticulitis. Denies fever/chills. Tolerating PO intake without n/v/d.  Patient's last menstrual period was 12/10/2022 (exact date).  Past Surgical History:  Procedure Laterality Date   NO PAST SURGERIES       OBJECTIVE:  Vitals:   12/15/22 1110  BP: 116/76  Pulse: 72  Resp: 17  Temp: 98.2 F (36.8 C)  TempSrc: Oral  SpO2: 97%    General appearance: alert, oriented, no acute distress HEENT: [place]; AT; oropharynx moist Lungs: unlabored respirations Abdomen: soft; without distention; mild  and poorly localized tenderness to palpation over left lower quad ; normal bowel sounds; without masses or organomegaly; without guarding or rebound tenderness Back: without reported CVA tenderness; FROM at waist Extremities:  without LE edema; symmetrical; without gross deformities Skin: warm and dry Neurologic: normal gait Psychological: alert and cooperative; normal mood and affect   No Known Allergies                                             Past Medical History:  Diagnosis Date   Headache    Vertigo     Social History   Socioeconomic History   Marital status: Married    Spouse name: Not on file   Number of children: Not on file   Years of education: Not on file   Highest education level: Not on file  Occupational History   Not on file  Tobacco Use   Smoking  status: Never   Smokeless tobacco: Never  Vaping Use   Vaping status: Never Used  Substance and Sexual Activity   Alcohol use: No   Drug use: No   Sexual activity: Not on file  Other Topics Concern   Not on file  Social History Narrative   Not on file   Social Determinants of Health   Financial Resource Strain: Not on file  Food Insecurity: Not on file  Transportation Needs: Not on file  Physical Activity: Not on file  Stress: Not on file  Social Connections: Not on file  Intimate Partner Violence: Not on file    Family History  Problem Relation Age of Onset   Diabetes Mother      Mardella Layman, MD 12/17/22 1215

## 2023-10-29 ENCOUNTER — Ambulatory Visit (HOSPITAL_BASED_OUTPATIENT_CLINIC_OR_DEPARTMENT_OTHER)
Admission: RE | Admit: 2023-10-29 | Discharge: 2023-10-29 | Disposition: A | Discharge status: Home / Self Care | Payer: Self-pay | Source: Ambulatory Visit | Attending: Family Medicine | Admitting: Family Medicine

## 2023-10-29 ENCOUNTER — Encounter (HOSPITAL_BASED_OUTPATIENT_CLINIC_OR_DEPARTMENT_OTHER): Payer: Self-pay

## 2023-10-29 VITALS — BP 123/84 | HR 73 | Temp 98.4°F | Resp 20

## 2023-10-29 DIAGNOSIS — G44209 Tension-type headache, unspecified, not intractable: Secondary | ICD-10-CM

## 2023-10-29 MED ORDER — ONDANSETRON 4 MG PO TBDP
4.0000 mg | ORAL_TABLET | Freq: Once | ORAL | Status: AC
  Administered 2023-10-29: 4 mg via ORAL

## 2023-10-29 MED ORDER — DEXAMETHASONE SODIUM PHOSPHATE 10 MG/ML IJ SOLN
10.0000 mg | Freq: Once | INTRAMUSCULAR | Status: AC
  Administered 2023-10-29: 10 mg via INTRAMUSCULAR

## 2023-10-29 NOTE — Discharge Instructions (Addendum)
 Steroid injection given here for migraine and Zofran  for nausea. Go home and rest. You can take tylenol  500 mg extra strength every 8 hours and ibuprofen  600 mg every 8 hours for pain.  Recommend establishing with primary care for management of this.  Could be related to hormone fluctuations.   Aqu le administraron una inyeccin de esteroides para la migraa y Zofran  para las nuseas. Vaya a casa y descanse. Puede tomar Tylenol  500 mg extra fuerte cada 8 horas e ibuprofeno 600 mg cada 8 horas para el dolor.  Se recomienda establecer contacto con atencin primaria para el manejo de esto.  Podra estar relacionado con fluctuaciones hormonales.

## 2023-10-29 NOTE — ED Triage Notes (Signed)
 Headache onset yesterday. Left side with pain around eye. + nausea, light sensitivity. States was at ER a few weeks ago. CT scan done and good. Given an injection for the headache and sent home. No family physician. States always has a severe headache during menstrual cycle, which is now but sometimes additional headache during the month.

## 2023-11-01 NOTE — ED Provider Notes (Signed)
 [facility]    CSN: [id number] Arrival date & time: 10/29/23  1358      History   Chief Complaint Chief Complaint  Patient presents with   Migraine    Entered by patient    HPI Ariana Downs is a 38 y.o. female.   Patient is a 38 year old female presents today for migraine.  History of similar in the past.  Reports that she has these headaches typically around her menstrual cycle.  Sometimes once or twice a month.  The pain is located to the left temple area and around the left eye.  She has some mild nausea and photophobia.  She has been taking Tylenol  without any relief of her headaches.  Was in the ER for similar a few weeks ago and had a CT scan of the head which was normal.  No change in vision, double vision, vomiting Translator used.    Migraine    Past Medical History:  Diagnosis Date   Headache    Vertigo     Patient Active Problem List   Diagnosis Date Noted   Gestational hypertension 11/24/2019   Proteinuria affecting pregnancy in third trimester 10/17/2019    Past Surgical History:  Procedure Laterality Date   NO PAST SURGERIES      OB History     Gravida  4   Para  2   Term  2   Preterm      AB  1   Living  2      SAB  1   IAB      Ectopic      Multiple      Live Births               Home Medications    Prior to Admission medications   Medication Sig Start Date End Date Taking? Authorizing Provider  acetaminophen  (TYLENOL ) 500 MG tablet Take 1,000 mg by mouth every 6 (six) hours as needed for moderate pain or headache.    [provider]  ibuprofen  (ADVIL ) 600 MG tablet Take 1 tablet (600 mg total) by mouth every 8 (eight) hours as needed for mild pain. 11/26/19   Tresia Fruit, MD  norethindrone  (ORTHO MICRONOR ) 0.35 MG tablet Take 1 tablet (0.35 mg total) by mouth daily. 11/26/19 11/25/20  Tresia Fruit, MD    Family History Family History  Problem Relation Age of Onset   Diabetes  Mother     Social History Social History   Tobacco Use   Smoking status: Never   Smokeless tobacco: Never  Vaping Use   Vaping status: Never Used  Substance Use Topics   Alcohol use: No   Drug use: No     Allergies   Patient has no known allergies.   Review of Systems Review of Systems See HPI  Physical Exam Triage Vital Signs ED Triage Vitals  Encounter Vitals Group     BP 10/29/23 1432 123/84     Systolic BP Percentile --      Diastolic BP Percentile --      Pulse Rate 10/29/23 1432 73     Resp 10/29/23 1432 20     Temp 10/29/23 1432 98.4 F (36.9 C)     Temp src --      SpO2 10/29/23 1432 97 %     Weight --      Height --      Head Circumference --      Peak Flow --  Pain Score 10/29/23 1433 7     Pain Loc --      Pain Education --      Exclude from Growth Chart --    No data found.  Updated Vital Signs BP 123/84 (BP Location: Right Arm)   Pulse 73   Temp 98.4 F (36.9 C)   Resp 20   LMP 10/26/2023   SpO2 97%   Visual Acuity Right Eye Distance:   Left Eye Distance:   Bilateral Distance:    Right Eye Near:   Left Eye Near:    Bilateral Near:     Physical Exam Constitutional:      General: She is not in acute distress.    Appearance: Normal appearance. She is not ill-appearing, toxic-appearing or diaphoretic.  HENT:     Head: Normocephalic and atraumatic.     Right Ear: Tympanic membrane and ear canal normal.     Left Ear: Tympanic membrane and ear canal normal.     Mouth/Throat:     Pharynx: Oropharynx is clear.  Eyes:     Extraocular Movements: Extraocular movements intact.     Conjunctiva/sclera: Conjunctivae normal.     Pupils: Pupils are equal, round, and reactive to light.  Pulmonary:     Effort: Pulmonary effort is normal.  Skin:    General: Skin is warm and dry.  Neurological:     General: No focal deficit present.     Mental Status: She is alert.     Cranial Nerves: No cranial nerve deficit, dysarthria or facial  asymmetry.     Motor: No weakness or tremor.     Gait: Gait normal.  Psychiatric:        Mood and Affect: Mood normal.      UC Treatments / Results  Labs (all labs ordered are listed, but only abnormal results are displayed) Labs Reviewed - No data to display  EKG   Radiology No results found.  Procedures Procedures (including critical care time)  Medications Ordered in UC Medications  dexamethasone (DECADRON) injection 10 mg (10 mg Intramuscular Given 10/29/23 1454)  ondansetron  (ZOFRAN -ODT) disintegrating tablet 4 mg (4 mg Oral Given 10/29/23 1454)    Initial Impression / Assessment and Plan / UC Course  I have reviewed the triage vital signs and the nursing notes.  Pertinent labs & imaging results that were available during my care of the patient were reviewed by me and considered in my medical decision making (see chart for details).     Acute non-intractable tension type headache-patient having headaches mostly around her menstrual cycle.  Most likely hormonal related.  No concerns on exam today.  Treated with Decadron injection and Zofran  for nausea here in clinic. Recommended Tylenol  Extra Strength every 8 hours and or Profen 600 mg every 8 hours for pain as needed. Also recommended establishing with a primary care provider for management of these chronic migraines.  She may need referral to neurologist.  Final Clinical Impressions(s) / UC Diagnoses   Final diagnoses:  Acute non intractable tension-type headache   Discharge Instructions      Steroid injection given here for migraine and Zofran  for nausea. Go home and rest. You can take tylenol  500 mg extra strength every 8 hours and ibuprofen  600 mg every 8 hours for pain.  Recommend establishing with primary care for management of this.  Could be related to hormone fluctuations.   Aqu le administraron una inyeccin de esteroides para la migraa y Zofran  para las nuseas.  Vaya a casa y descanse. Puede tomar  Tylenol  500 mg extra fuerte cada 8 horas e ibuprofeno 600 mg cada 8 horas para el dolor.  Se recomienda establecer contacto con atencin primaria para el manejo de esto.  Podra estar relacionado con fluctuaciones hormonales.  ED Prescriptions   None    PDMP not reviewed this encounter.   Landa Pine, FNP 11/01/23 [phone]
